# Patient Record
Sex: Male | Born: 1955 | Hispanic: No
Health system: Southern US, Community
[De-identification: ages and names within clinical notes are randomized; demographics above are authoritative.]

## PROBLEM LIST (undated history)

## (undated) DIAGNOSIS — I10 Essential (primary) hypertension: Secondary | ICD-10-CM

## (undated) HISTORY — DX: Essential (primary) hypertension: I10

---
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
## 2014-02-22 ENCOUNTER — Encounter: Payer: Self-pay | Admitting: Sports Medicine

## 2014-02-22 ENCOUNTER — Ambulatory Visit (INDEPENDENT_AMBULATORY_CARE_PROVIDER_SITE_OTHER): Payer: BC Managed Care – PPO

## 2014-02-22 ENCOUNTER — Ambulatory Visit (INDEPENDENT_AMBULATORY_CARE_PROVIDER_SITE_OTHER): Payer: BC Managed Care – PPO | Admitting: Sports Medicine

## 2014-02-22 VITALS — BP 124/90 | HR 68 | Ht 68.0 in | Wt 211.0 lb

## 2014-02-22 DIAGNOSIS — M7541 Impingement syndrome of right shoulder: Secondary | ICD-10-CM | POA: Insufficient documentation

## 2014-02-22 DIAGNOSIS — M25819 Other specified joint disorders, unspecified shoulder: Secondary | ICD-10-CM

## 2014-02-22 DIAGNOSIS — M7580 Other shoulder lesions, unspecified shoulder: Secondary | ICD-10-CM

## 2014-02-22 DIAGNOSIS — M5412 Radiculopathy, cervical region: Secondary | ICD-10-CM

## 2014-02-22 DIAGNOSIS — M79609 Pain in unspecified limb: Secondary | ICD-10-CM

## 2014-02-22 DIAGNOSIS — M5030 Other cervical disc degeneration, unspecified cervical region: Secondary | ICD-10-CM

## 2014-02-22 DIAGNOSIS — R209 Unspecified disturbances of skin sensation: Secondary | ICD-10-CM

## 2014-02-22 MED ORDER — GABAPENTIN 300 MG PO CAPS: ORAL_CAPSULE | ORAL | Status: DC

## 2014-02-22 MED ORDER — PREDNISONE 50 MG PO TABS: ORAL_TABLET | ORAL | Status: DC

## 2014-02-22 MED ORDER — MELOXICAM 15 MG PO TABS: ORAL_TABLET | ORAL | Status: DC

## 2014-02-22 NOTE — Progress Notes (Signed)
   Subjective:    I'm seeing this patient as a consultation for:  Dr. Terrence Dupont  CC:  Right arm numbness  HPI: This is a pleasant 58 year old male, for the past several weeks he's had numbness and tingling running down the back of his right arm into the third, fourth, and fifth fingers. It keeps him awake at night, moderate, persistent.  Right shoulder pain: Worse of the deltoid, and with overhead activities, moderate, persistent, no trauma, does get a clicking sensation.  Past medical history, Surgical history, Family history not pertinant except as noted below, Social history, Allergies, and medications have been entered into the medical record, reviewed, and no changes needed.   Review of Systems: No headache, visual changes, nausea, vomiting, diarrhea, constipation, dizziness, abdominal pain, skin rash, fevers, chills, night sweats, weight loss, swollen lymph nodes, body aches, joint swelling, muscle aches, chest pain, shortness of breath, mood changes, visual or auditory hallucinations.   Objective:   General: Well Developed, well nourished, and in no acute distress.  Neuro/Psych: Alert and oriented x3, extra-ocular muscles intact, able to move all 4 extremities, sensation grossly intact. Skin: Warm and dry, no rashes noted.  Respiratory: Not using accessory muscles, speaking in full sentences, trachea midline.  Cardiovascular: Pulses palpable, no extremity edema. Abdomen: Does not appear distended. Neck: Inspection unremarkable. No palpable stepoffs. Negative Spurling's maneuver. Full neck range of motion Grip strength and sensation normal in bilateral hands Strength good C4 to T1 distribution No sensory change to C4 to T1 Negative Hoffman sign bilaterally Reflexes normal Right Shoulder: Inspection reveals no abnormalities, atrophy or asymmetry. Palpation is normal with no tenderness over AC joint or bicipital groove. ROM is full in all planes. Rotator cuff strength normal  throughout. Positive Neer and Hawkin's tests, empty can sign. Speeds and Yergason's tests normal. No labral pathology noted with negative Obrien's, negative clunk and good stability. Normal scapular function observed. No painful arc and no drop arm sign. No apprehension sign  Shoulder x-rays are unremarkable, C-spine x-rays show multilevel degenerative changes worst at the C5-C6 levels.  Impression and Recommendations:   This case required medical decision making of moderate complexity.

## 2014-02-22 NOTE — Assessment & Plan Note (Signed)
Most likely subacromial bursitis. X-rays, formal PT, Mobic. Return in one month, injection if no better.

## 2014-02-22 NOTE — Assessment & Plan Note (Signed)
Prednisone, x-rays, Mobic, attention. Clinically right C7. Formal physical therapy. Return in one month, MRI for interventional injection planning if no better.

## 2014-03-08 ENCOUNTER — Ambulatory Visit (INDEPENDENT_AMBULATORY_CARE_PROVIDER_SITE_OTHER): Payer: BC Managed Care – PPO | Admitting: Physical Therapy

## 2014-03-08 DIAGNOSIS — M25819 Other specified joint disorders, unspecified shoulder: Secondary | ICD-10-CM

## 2014-03-08 DIAGNOSIS — M2550 Pain in unspecified joint: Secondary | ICD-10-CM

## 2014-03-08 DIAGNOSIS — M7580 Other shoulder lesions, unspecified shoulder: Secondary | ICD-10-CM

## 2014-03-08 DIAGNOSIS — M2560 Stiffness of unspecified joint, not elsewhere classified: Secondary | ICD-10-CM

## 2014-03-14 ENCOUNTER — Encounter (INDEPENDENT_AMBULATORY_CARE_PROVIDER_SITE_OTHER): Payer: BC Managed Care – PPO

## 2014-03-14 DIAGNOSIS — M7580 Other shoulder lesions, unspecified shoulder: Secondary | ICD-10-CM

## 2014-03-14 DIAGNOSIS — M2550 Pain in unspecified joint: Secondary | ICD-10-CM

## 2014-03-14 DIAGNOSIS — M2560 Stiffness of unspecified joint, not elsewhere classified: Secondary | ICD-10-CM

## 2014-03-14 DIAGNOSIS — M25819 Other specified joint disorders, unspecified shoulder: Secondary | ICD-10-CM

## 2014-03-16 ENCOUNTER — Encounter (INDEPENDENT_AMBULATORY_CARE_PROVIDER_SITE_OTHER): Payer: BC Managed Care – PPO

## 2014-03-16 DIAGNOSIS — M2550 Pain in unspecified joint: Secondary | ICD-10-CM

## 2014-03-16 DIAGNOSIS — M25819 Other specified joint disorders, unspecified shoulder: Secondary | ICD-10-CM

## 2014-03-16 DIAGNOSIS — M2560 Stiffness of unspecified joint, not elsewhere classified: Secondary | ICD-10-CM

## 2014-03-16 DIAGNOSIS — M7580 Other shoulder lesions, unspecified shoulder: Secondary | ICD-10-CM

## 2014-03-20 ENCOUNTER — Encounter (INDEPENDENT_AMBULATORY_CARE_PROVIDER_SITE_OTHER): Payer: BC Managed Care – PPO | Admitting: Physical Therapy

## 2014-03-20 DIAGNOSIS — M2550 Pain in unspecified joint: Secondary | ICD-10-CM

## 2014-03-20 DIAGNOSIS — M25819 Other specified joint disorders, unspecified shoulder: Secondary | ICD-10-CM

## 2014-03-20 DIAGNOSIS — M7580 Other shoulder lesions, unspecified shoulder: Secondary | ICD-10-CM

## 2014-03-20 DIAGNOSIS — M2560 Stiffness of unspecified joint, not elsewhere classified: Secondary | ICD-10-CM

## 2014-03-22 ENCOUNTER — Ambulatory Visit (INDEPENDENT_AMBULATORY_CARE_PROVIDER_SITE_OTHER): Payer: BC Managed Care – PPO | Admitting: Sports Medicine

## 2014-03-22 ENCOUNTER — Telehealth: Payer: Self-pay | Admitting: *Deleted

## 2014-03-22 ENCOUNTER — Encounter: Payer: Self-pay | Admitting: Sports Medicine

## 2014-03-22 VITALS — BP 113/78 | HR 64 | Ht 68.0 in | Wt 216.0 lb

## 2014-03-22 DIAGNOSIS — M5412 Radiculopathy, cervical region: Secondary | ICD-10-CM

## 2014-03-22 DIAGNOSIS — M7541 Impingement syndrome of right shoulder: Secondary | ICD-10-CM

## 2014-03-22 DIAGNOSIS — M7580 Other shoulder lesions, unspecified shoulder: Secondary | ICD-10-CM

## 2014-03-22 DIAGNOSIS — M25819 Other specified joint disorders, unspecified shoulder: Secondary | ICD-10-CM

## 2014-03-22 NOTE — Telephone Encounter (Signed)
Precert obtained through [facility] online for MRI C spine w/o contrast. Auth # [id number] good from 03/22/14 to 04/20/14. Clemetine Marker, LPN

## 2014-03-22 NOTE — Progress Notes (Signed)
  Subjective:    CC: Followup  HPI: Right shoulder impingement syndrome: Resolved with physical therapy and NSAIDs.  Right cervical radiculopathy : improved significantly after PT, steroids, in fact after his steroids he was 100% better, unfortunately the pain continues, it is mild but tends to bother him significantly. It is mild, persistent.  Past medical history, Surgical history, Family history not pertinant except as noted below, Social history, Allergies, and medications have been entered into the medical record, reviewed, and no changes needed.   Review of Systems: No fevers, chills, night sweats, weight loss, chest pain, or shortness of breath.   Objective:    General: Well Developed, well nourished, and in no acute distress.  Neuro: Alert and oriented x3, extra-ocular muscles intact, sensation grossly intact.  HEENT: Normocephalic, atraumatic, pupils equal round reactive to light, neck supple, no masses, no lymphadenopathy, thyroid nonpalpable.  Skin: Warm and dry, no rashes. Cardiac: Regular rate and rhythm, no murmurs rubs or gallops, no lower extremity edema.  Respiratory: Clear to auscultation bilaterally. Not using accessory muscles, speaking in full sentences.  He did have some suspicious findings on his x-rays.  Impression and Recommendations:

## 2014-03-22 NOTE — Assessment & Plan Note (Signed)
Symptoms of subacromial bursitis have completely resolved after formal physical therapy.

## 2014-03-22 NOTE — Assessment & Plan Note (Signed)
Persistent right-sided clinically C7 radicular symptoms. He did well with physical therapy, excellent response to steroids orally but only temporary. At this point we are going to proceed with an MRI for interventional injection planning. Return for MRI results.

## 2014-03-27 ENCOUNTER — Ambulatory Visit (HOSPITAL_BASED_OUTPATIENT_CLINIC_OR_DEPARTMENT_OTHER)
Admission: RE | Admit: 2014-03-27 | Discharge: 2014-03-27 | Disposition: A | Discharge status: Home / Self Care | Payer: BC Managed Care – PPO | Source: Ambulatory Visit | Attending: Sports Medicine | Admitting: Sports Medicine

## 2014-03-27 DIAGNOSIS — M5412 Radiculopathy, cervical region: Secondary | ICD-10-CM

## 2014-03-27 DIAGNOSIS — M5030 Other cervical disc degeneration, unspecified cervical region: Secondary | ICD-10-CM | POA: Insufficient documentation

## 2014-04-13 ENCOUNTER — Encounter: Payer: Self-pay | Admitting: Sports Medicine

## 2014-04-13 ENCOUNTER — Ambulatory Visit (INDEPENDENT_AMBULATORY_CARE_PROVIDER_SITE_OTHER): Payer: BC Managed Care – PPO | Admitting: Sports Medicine

## 2014-04-13 VITALS — BP 99/65 | HR 59 | Ht 69.0 in | Wt 215.0 lb

## 2014-04-13 DIAGNOSIS — M5412 Radiculopathy, cervical region: Secondary | ICD-10-CM

## 2014-04-13 NOTE — Progress Notes (Signed)
  Subjective:    CC: Followup  HPI: Cervical radiculitis: This pleasant 59 year old male has right-sided C7 radicular symptoms, he has been through formal physical therapy, steroids, NSAIDs, muscle relaxers, symptoms have persisted. We recently obtained an MRI the results of which will be dictated below. Pain is moderate, persistent, no constitutional symptoms, no lower extremity symptoms, no bowel or bladder dysfunction.  Past medical history, Surgical history, Family history not pertinant except as noted below, Social history, Allergies, and medications have been entered into the medical record, reviewed, and no changes needed.   Review of Systems: No fevers, chills, night sweats, weight loss, chest pain, or shortness of breath.   Objective:    General: Well Developed, well nourished, and in no acute distress.  Neuro: Alert and oriented x3, extra-ocular muscles intact, sensation grossly intact.  HEENT: Normocephalic, atraumatic, pupils equal round reactive to light, neck supple, no masses, no lymphadenopathy, thyroid nonpalpable.  Skin: Warm and dry, no rashes. Cardiac: Regular rate and rhythm, no murmurs rubs or gallops, no lower extremity edema.  Respiratory: Clear to auscultation bilaterally. Not using accessory muscles, speaking in full sentences.  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  Impression and Recommendations:

## 2014-04-13 NOTE — Assessment & Plan Note (Signed)
Persistent clinically right C7 symptoms despite physical therapy, steroids, gabapentin, NSAIDs. At this point we are going to proceed with a right-sided C6-C7 transforaminal epidural injection. Increase gabapentin. Orders placed.

## 2014-12-11 ENCOUNTER — Encounter: Payer: Self-pay | Admitting: Family Medicine

## 2014-12-11 ENCOUNTER — Ambulatory Visit (INDEPENDENT_AMBULATORY_CARE_PROVIDER_SITE_OTHER): Payer: 59

## 2014-12-11 ENCOUNTER — Other Ambulatory Visit: Payer: Self-pay | Admitting: Family Medicine

## 2014-12-11 ENCOUNTER — Ambulatory Visit (INDEPENDENT_AMBULATORY_CARE_PROVIDER_SITE_OTHER): Payer: 59 | Admitting: Family Medicine

## 2014-12-11 VITALS — BP 144/77 | HR 63 | Ht 69.0 in | Wt 209.0 lb

## 2014-12-11 DIAGNOSIS — M545 Low back pain, unspecified: Secondary | ICD-10-CM

## 2014-12-11 DIAGNOSIS — R1904 Left lower quadrant abdominal swelling, mass and lump: Secondary | ICD-10-CM

## 2014-12-11 DIAGNOSIS — R1909 Other intra-abdominal and pelvic swelling, mass and lump: Secondary | ICD-10-CM

## 2014-12-11 DIAGNOSIS — M4804 Spinal stenosis, thoracic region: Secondary | ICD-10-CM

## 2014-12-11 DIAGNOSIS — M2578 Osteophyte, vertebrae: Secondary | ICD-10-CM

## 2014-12-11 NOTE — Progress Notes (Signed)
CC: Mark Vazquez is a 59 y.o. male is here for Establish Care   Subjective: HPI:  Very pleasant 59 year old here to establish care  Patient complains of a growth in the left flank that has been present for an unknown amount of time. For at least the last month he has noticed in the mirror that the left flank appears bigger than the right flank. He believes that he can feel some swelling but he is uncertain about a mass in this region of swelling. He denies any recent or remote trauma to this region of the body. He denies any pain or tenderness to this region the body. No interventions as of yet. He takes it might be growing but he is unsure.  Complains of midline low back pain right greater than left that has been present off and on for matter of at least 3 months. It seems to be worse in cold weather. Is worse with bending down or rotating to the right. It is alleviated in warm weather or when sitting erect. Pain is nonradiating mild in severity and no interventions have occurred as of yet. He has meloxicam at home but has not tried taking it yet. Denies any motor or sensory disturbances in the lower extremities or saddle paresthesia or bowel or bladder incontinence  Review of Systems - General ROS: negative for - chills, fever, night sweats, weight gain or weight loss Ophthalmic ROS: negative for - decreased vision Psychological ROS: negative for - anxiety or depression ENT ROS: negative for - hearing change, nasal congestion, tinnitus or allergies Hematological and Lymphatic ROS: negative for - bleeding problems, bruising or swollen lymph nodes Breast ROS: negative Respiratory ROS: no cough, shortness of breath, or wheezing Cardiovascular ROS: no chest pain or dyspnea on exertion Gastrointestinal ROS: no abdominal pain, change in bowel habits, or black or bloody stools Genito-Urinary ROS: negative for - genital discharge, genital ulcers, incontinence or abnormal bleeding from  genitals Musculoskeletal ROS: negative for - joint pain or muscle pain other than back pain today Neurological ROS: negative for - headaches or memory loss Dermatological ROS: negative for lumps, mole changes, rash and skin lesion changes other than that described above  Past Medical History  Diagnosis Date  . Hypertension     No past surgical history on file. Family History  Problem Relation Age of Onset  . Hyperlipidemia    . Hypertension      History   Social History  . Marital Status: Significant Other    Spouse Name: N/A    Number of Children: N/A  . Years of Education: N/A   Occupational History  . Not on file.   Social History Main Topics  . Smoking status: Former Smoker    Quit date: 12/11/2008  . Smokeless tobacco: Not on file  . Alcohol Use: Not on file  . Drug Use: No  . Sexual Activity:    Partners: Female   Other Topics Concern  . Not on file   Social History Narrative     Objective: BP 144/77 mmHg  Pulse 63  Ht 5\' 9"  (1.753 m)  Wt 209 lb (94.802 kg)  BMI 30.85 kg/m2  General: Alert and Oriented, No Acute Distress HEENT: Pupils equal, round, reactive to light. Conjunctivae clear.  Moist mucous membranes Lungs: Clear comfortable work of breathing Cardiac: Regular rate and rhythm.  Abdomen: Normal bowel sounds, soft and non tender. In the left quadrant in the most lateral portion of the adipose tissue of the left  flank there is a approximate 2 cm x 3 cm egg-shaped mass which is soft but just slightly firmer than surrounding adipose tissue. It is nontender, there is no overlying skin changes. Back: No midline spinous process tenderness to palpation in the lumbar region nor paraspinal musculature tenderness to palpation in the paraspinal muscular region. Full range of motion and strength in the lumbar spine. Stork test negative bilaterally. Full range of motion and strength of both lower extremities with L4 and S1 DTR 2 over 4 bilaterally Extremities: No  peripheral edema.  Strong peripheral pulses.  Mental Status: No depression, anxiety, nor agitation. Skin: Warm and dry.  Assessment & Plan: Mark Vazquez was seen today for establish care.  Diagnoses and associated orders for this visit:  Midline low back pain - DG Lumbar Spine Complete; Future - DG Abd 2 Views; Future  Abdominal mass, LLQ (left lower quadrant) - DG Lumbar Spine Complete; Future - DG Abd 2 Views; Future    Midline low back pain: Encouraged to try meloxicam at home, obtaining x-rays above for further evaluation. 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 With this request an x-ray will be obtained as I anticipate we would be able to see calcifications at the site of this palpable abnormality if it were concerning for cancer  Return if symptoms worsen or fail to improve.

## 2014-12-12 ENCOUNTER — Telehealth: Payer: Self-pay | Admitting: Family Medicine

## 2014-12-12 DIAGNOSIS — M545 Low back pain, unspecified: Secondary | ICD-10-CM

## 2014-12-12 DIAGNOSIS — D171 Benign lipomatous neoplasm of skin and subcutaneous tissue of trunk: Secondary | ICD-10-CM | POA: Insufficient documentation

## 2014-12-12 MED ORDER — MELOXICAM 15 MG PO TABS: 15.0000 mg | ORAL_TABLET | Freq: Every day | ORAL | Status: DC

## 2014-12-12 MED ORDER — METHOCARBAMOL 500 MG PO TABS: 500.0000 mg | ORAL_TABLET | Freq: Three times a day (TID) | ORAL | Status: DC | PRN

## 2014-12-12 NOTE — Telephone Encounter (Signed)
Pt.notified

## 2014-12-12 NOTE — Telephone Encounter (Signed)
Low back exercises mailed

## 2014-12-12 NOTE — Telephone Encounter (Signed)
Mark Vazquez, Will you please let patient know that his back xray showed only minimal disc space narrowing but not to a degree that his spine would be causing his back pain.  I think his pain is coming from stiff muscles surrounding the spine therefore I'd recommend starting meloxicam and robaxin daily.  This is an anti-inflammatory and muscle relaxer, respectively.   Can you also provide him with the low back pain handout, stretches to be done on a daily basis for three weeks.  There was no signs of cancer in the swelling of his left abdomen, this is coming from a Lipoma which is benign and needs no further workup if painless.

## 2015-03-27 ENCOUNTER — Encounter: Payer: Self-pay | Admitting: Family Medicine

## 2015-03-27 ENCOUNTER — Ambulatory Visit (INDEPENDENT_AMBULATORY_CARE_PROVIDER_SITE_OTHER): Payer: 59 | Admitting: Family Medicine

## 2015-03-27 VITALS — BP 117/80 | HR 68 | Ht 69.0 in | Wt 207.0 lb

## 2015-03-27 DIAGNOSIS — Z1211 Encounter for screening for malignant neoplasm of colon: Secondary | ICD-10-CM

## 2015-03-27 DIAGNOSIS — K219 Gastro-esophageal reflux disease without esophagitis: Secondary | ICD-10-CM | POA: Diagnosis not present

## 2015-03-27 DIAGNOSIS — Z0000 Encounter for general adult medical examination without abnormal findings: Secondary | ICD-10-CM

## 2015-03-27 LAB — COMPLETE METABOLIC PANEL WITH GFR
ALT: 26 U/L (ref 0–53)
AST: 18 U/L (ref 0–37)
Albumin: 4.1 g/dL (ref 3.5–5.2)
Alkaline Phosphatase: 58 U/L (ref 39–117)
BUN: 16 mg/dL (ref 6–23)
CO2: 23 mEq/L (ref 19–32)
Calcium: 9.3 mg/dL (ref 8.4–10.5)
Chloride: 105 mEq/L (ref 96–112)
Creat: 1.05 mg/dL (ref 0.50–1.35)
GFR, Est African American: >89 mL/min
GFR, Est Non African American: 78 mL/min
Glucose, Bld: 82 mg/dL (ref 70–99)
Potassium: 4.1 mEq/L (ref 3.5–5.3)
Sodium: 137 mEq/L (ref 135–145)
Total Bilirubin: 0.5 mg/dL (ref 0.2–1.2)
Total Protein: 7.2 g/dL (ref 6.0–8.3)

## 2015-03-27 LAB — CBC
HCT: 45.4 % (ref 39.0–52.0)
Hemoglobin: 15 g/dL (ref 13.0–17.0)
MCH: 28.4 pg (ref 26.0–34.0)
MCHC: 33 g/dL (ref 30.0–36.0)
MCV: 85.8 fL (ref 78.0–100.0)
MPV: 11.7 fL (ref 8.6–12.4)
Platelets: 244 10*3/uL (ref 150–400)
RBC: 5.29 MIL/uL (ref 4.22–5.81)
RDW: 14.3 % (ref 11.5–15.5)
WBC: 6.9 10*3/uL (ref 4.0–10.5)

## 2015-03-27 LAB — LIPID PANEL
Cholesterol: 124 mg/dL (ref 0–200)
HDL: 36 mg/dL — ABNORMAL LOW (ref >=40)
LDL Cholesterol: 64 mg/dL (ref 0–99)
Total CHOL/HDL Ratio: 3.4 Ratio
Triglycerides: 122 mg/dL (ref <150)
VLDL: 24 mg/dL (ref 0–40)

## 2015-03-27 MED ORDER — PANTOPRAZOLE SODIUM 40 MG PO TBEC: 40.0000 mg | DELAYED_RELEASE_TABLET | Freq: Every day | ORAL | Status: DC

## 2015-03-27 NOTE — Patient Instructions (Signed)
Dr. Kymiah Araiza's General Advice Following Your Complete Physical Exam  The Benefits of Regular Exercise: Unless you suffer from an uncontrolled cardiovascular condition, studies strongly suggest that regular exercise and physical activity will add to both the quality and length of your life.  The World Health Organization recommends 150 minutes of moderate intensity aerobic activity every week.  This is best split over 3-4 days a week, and can be as simple as a brisk walk for just over 35 minutes "most days of the week".  This type of exercise has been shown to lower LDL-Cholesterol, lower average blood sugars, lower blood pressure, lower cardiovascular disease risk, improve memory, and increase one's overall sense of wellbeing.  The addition of anaerobic (or "strength training") exercises offers additional benefits including but not limited to increased metabolism, prevention of osteoporosis, and improved overall cholesterol levels.  How Can I Strive For A Low-Fat Diet?: Current guidelines recommend that 25-35 percent of your daily energy (food) intake should come from fats.  One might ask how can this be achieved without having to dissect each meal on a daily basis?  Switch to skim or 1% milk instead of whole milk.  Focus on lean meats such as ground turkey, fresh fish, baked chicken, and lean cuts of beef as your source of dietary protein.  Limit saturated fat consumption to less than 10% of your daily caloric intake.  Limit trans fatty acid consumption primarily by limiting synthetic trans fats such as partially hydrogenated oils (Ex: fried fast foods).  Substitute olive or vegetable oil for solid fats where possible.  Moderation of Salt Intake: Provided you don't carry a diagnosis of congestive heart failure nor renal failure, I recommend a daily allowance of no more than 2300 mg of salt (sodium).  Keeping under this daily goal is associated with a decreased risk of cardiovascular events, creeping  above it can lead to elevated blood pressures and increases your risk of cardiovascular events.  Milligrams (mg) of salt is listed on all nutrition labels, and your daily intake can add up faster than you think.  Most canned and frozen dinners can pack in over half your daily salt allowance in one meal.    Lifestyle Health Risks: Certain lifestyle choices carry specific health risks.  As you may already know, tobacco use has been associated with increasing one's risk of cardiovascular disease, pulmonary disease, numerous cancers, among many other issues.  What you may not know is that there are medications and nicotine replacement strategies that can more than double your chances of successfully quitting.  I would be thrilled to help manage your quitting strategy if you currently use tobacco products.  When it comes to alcohol use, I've yet to find an "ideal" daily allowance.  Provided an individual does not have a medical condition that is exacerbated by alcohol consumption, general guidelines determine "safe drinking" as no more than two standard drinks for a man or no more than one standard drink for a male per day.  However, much debate still exists on whether any amount of alcohol consumption is technically "safe".  My general advice, keep alcohol consumption to a minimum for general health promotion.  If you or others believe that alcohol, tobacco, or recreational drug use is interfering with your life, I would be happy to provide confidential counseling regarding treatment options.  General "Over The Counter" Nutrition Advice: Postmenopausal women should aim for a daily calcium intake of 1200 mg, however a significant portion of this might already be   provided by diets including milk, yogurt, cheese, and other dairy products.  Vitamin D has been shown to help preserve bone density, prevent fatigue, and has even been shown to help reduce falls in the elderly.  Ensuring a daily intake of 800 Units of  Vitamin D is a good place to start to enjoy the above benefits, we can easily check your Vitamin D level to see if you'd potentially benefit from supplementation beyond 800 Units a day.  Folic Acid intake should be of particular concern to women of childbearing age.  Daily consumption of 400-800 mcg of Folic Acid is recommended to minimize the chance of spinal cord defects in a fetus should pregnancy occur.    For many adults, accidents still remain one of the most common culprits when it comes to cause of death.  Some of the simplest but most effective preventitive habits you can adopt include regular seatbelt use, proper helmet use, securing firearms, and regularly testing your smoke and carbon monoxide detectors.  Sanjiv Castorena B. Pepper Wyndham DO [facility] [address], [address] [place], [place] [address] Phone: [phone]  

## 2015-03-27 NOTE — Progress Notes (Signed)
CC: Mark Vazquez is a 59 y.o. male is here for Annual Exam   Subjective: HPI:  Colonoscopy: Has never had a colonoscopy, this was encouraged today and a referral has been placed Prostate: Discussed screening risks/beneifts with patient during today's office visit, he is open to the idea of a PSA   Influenza Vaccine: No current indication Pneumovax: No current indication Td/Tdap: Up-to-date as of 3 years ago Zoster: (Start 58 yo)  Requesting complete physical exam with his only complaint being a right elbow pain that has been present for an unknown amount of time localized at the lateral epicondyle and worse with any extension of the wrist. Pain is mild in severity no interventions as of yet  He is requesting medication that is prescription strength and comparable to Prilosec OTC.  Review of Systems - General ROS: negative for - chills, fever, night sweats, weight gain or weight loss Ophthalmic ROS: negative for - decreased vision Psychological ROS: negative for - anxiety or depression ENT ROS: negative for - hearing change, nasal congestion, tinnitus or allergies Hematological and Lymphatic ROS: negative for - bleeding problems, bruising or swollen lymph nodes Breast ROS: negative Respiratory ROS: no cough, shortness of breath, or wheezing Cardiovascular ROS: no chest pain or dyspnea on exertion Gastrointestinal ROS: no abdominal pain, change in bowel habits, or black or bloody stools Genito-Urinary ROS: negative for - genital discharge, genital ulcers, incontinence or abnormal bleeding from genitals Musculoskeletal ROS: negative for - joint pain or muscle pain other than that described above Neurological ROS: negative for - headaches or memory loss Dermatological ROS: negative for lumps, mole changes, rash and skin lesion changes  Past Medical History  Diagnosis Date  . Hypertension     No past surgical history on file. Family History  Problem Relation Age of Onset  .  Hyperlipidemia    . Hypertension      History   Social History  . Marital Status: Significant Other    Spouse Name: N/A  . Number of Children: N/A  . Years of Education: N/A   Occupational History  . Not on file.   Social History Main Topics  . Smoking status: Former Smoker    Quit date: 12/11/2008  . Smokeless tobacco: Not on file  . Alcohol Use: Not on file  . Drug Use: No  . Sexual Activity:    Partners: Female   Other Topics Concern  . Not on file   Social History Narrative     Objective: BP 117/80 mmHg  Pulse 68  Ht 5\' 9"  (1.753 m)  Wt 207 lb (93.895 kg)  BMI 30.55 kg/m2  General: No Acute Distress HEENT: Atraumatic, normocephalic, conjunctivae normal without scleral icterus.  No nasal discharge, hearing grossly intact, TMs with good landmarks bilaterally with no middle ear abnormalities, posterior pharynx clear without oral lesions. Neck: Supple, trachea midline, no cervical nor supraclavicular adenopathy. Pulmonary: Clear to auscultation bilaterally without wheezing, rhonchi, nor rales. Cardiac: Regular rate and rhythm.  No murmurs, rubs, nor gallops. No peripheral edema.  2+ peripheral pulses bilaterally. Abdomen: Bowel sounds normal.  No masses.  Non-tender without rebound.  Negative Murphy's sign. MSK: Grossly intact, no signs of weakness.  Full strength throughout upper and lower extremities.  Full ROM in upper and lower extremities.  No midline spinal tenderness. Right lateral epicondyle pain with resisted wrist extension Neuro: Gait unremarkable, CN II-XII grossly intact.  C5-C6 Reflex 2/4 Bilaterally, L4 Reflex 2/4 Bilaterally.  Cerebellar function intact. Skin: No rashes. Psych: Alert  and oriented to person/place/time.  Thought process normal. No anxiety/depression.  Assessment & Plan: Ezra was seen today for annual exam.  Diagnoses and all orders for this visit:  Annual physical exam Orders: -     Lipid panel -     COMPLETE METABOLIC PANEL WITH  GFR -     CBC -     PSA  Colon cancer screening Orders: -     Ambulatory referral to Gastroenterology  Gastroesophageal reflux disease, esophagitis presence not specified  Other orders -     pantoprazole (PROTONIX) 40 MG tablet; Take 1 tablet (40 mg total) by mouth daily.   Healthy lifestyle interventions including but not limited to regular exercise, a healthy low fat diet, moderation of salt intake, the dangers of tobacco/alcohol/recreational drug use, nutrition supplementation, and accident avoidance were discussed with the patient and a handout was provided for future reference.  He was given a home rehabilitation handout for treatment of right tennis elbow. Encouraged to do this on a daily basis for 2 weeks at least.  Substitute Prilosec OTC with Protonix. Return if symptoms worsen or fail to improve.

## 2015-03-28 LAB — PSA: PSA: 0.38 ng/mL (ref <=4.00)

## 2015-08-07 ENCOUNTER — Other Ambulatory Visit: Payer: Self-pay | Admitting: Family Medicine

## 2015-09-25 ENCOUNTER — Other Ambulatory Visit: Payer: Self-pay | Admitting: Family Medicine

## 2015-11-17 IMAGING — CR DG CERVICAL SPINE COMPLETE 4+V
5 series · 5 of 5 positions shown · non-contrast
Comparison: None.

CLINICAL DATA: Right arm pain with numbness and tingling

EXAM:
CERVICAL SPINE  4+ VIEWS

[view not recorded (1 of 5)]
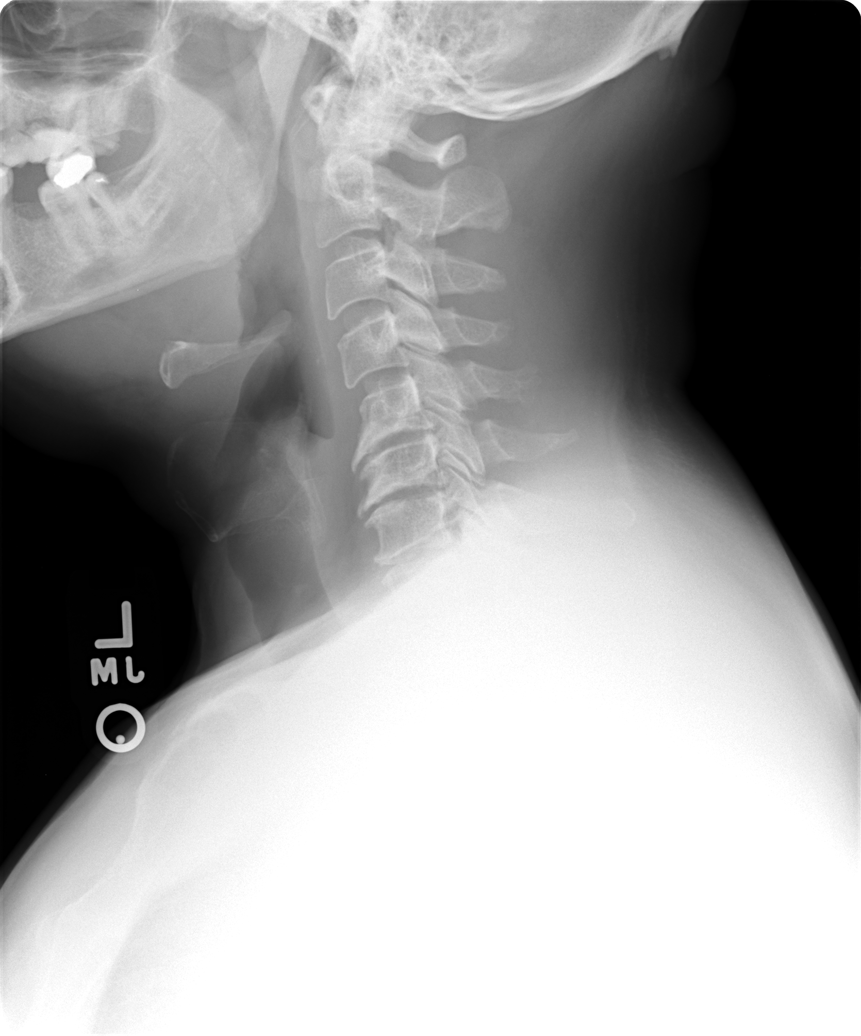

[view not recorded (2 of 5)]
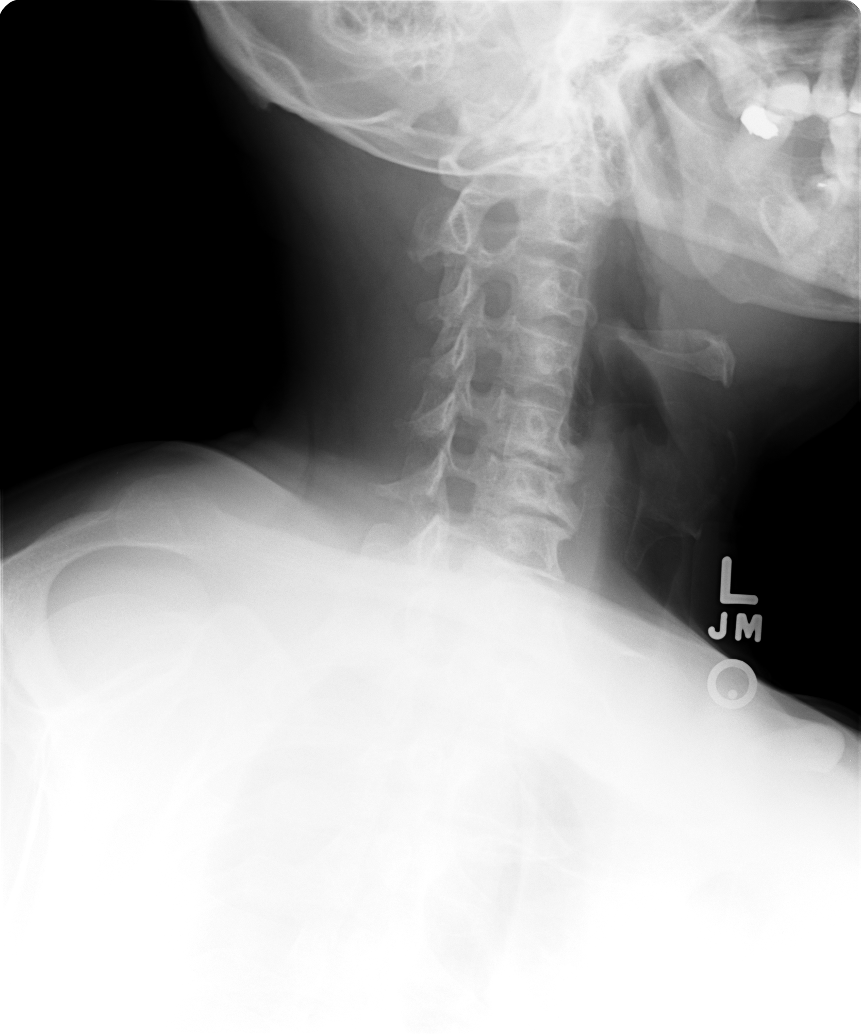

[view not recorded (3 of 5)]
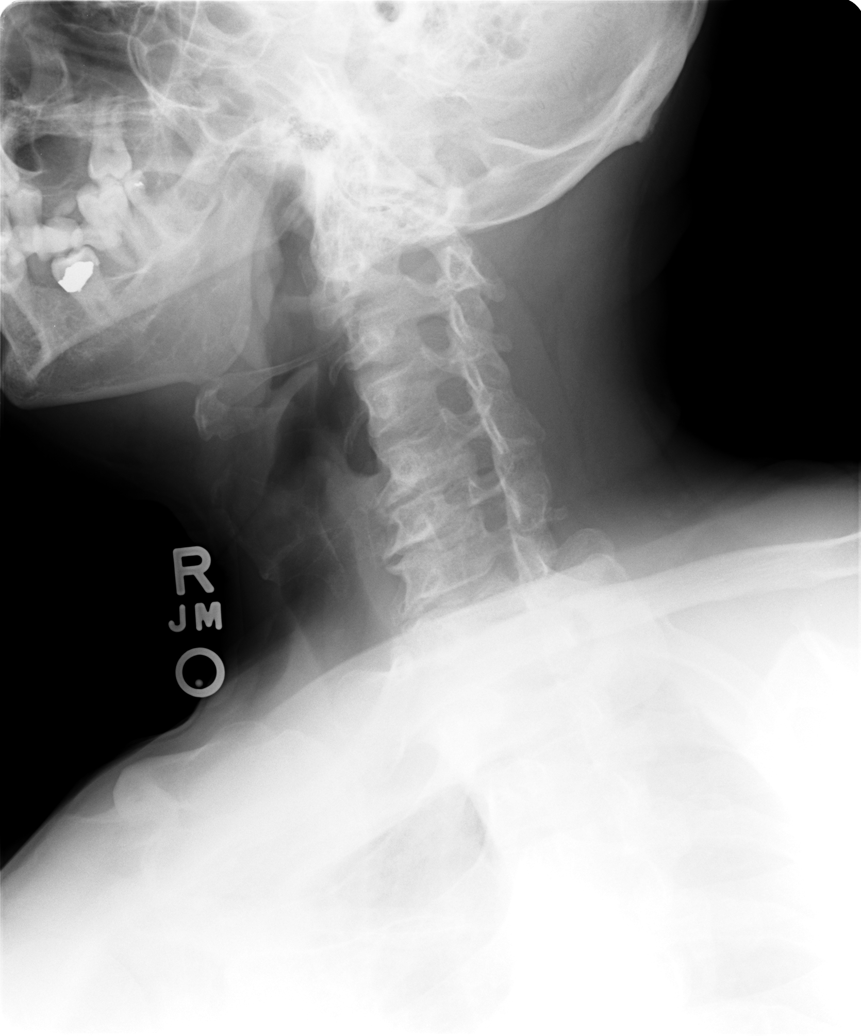

[view not recorded (4 of 5)]
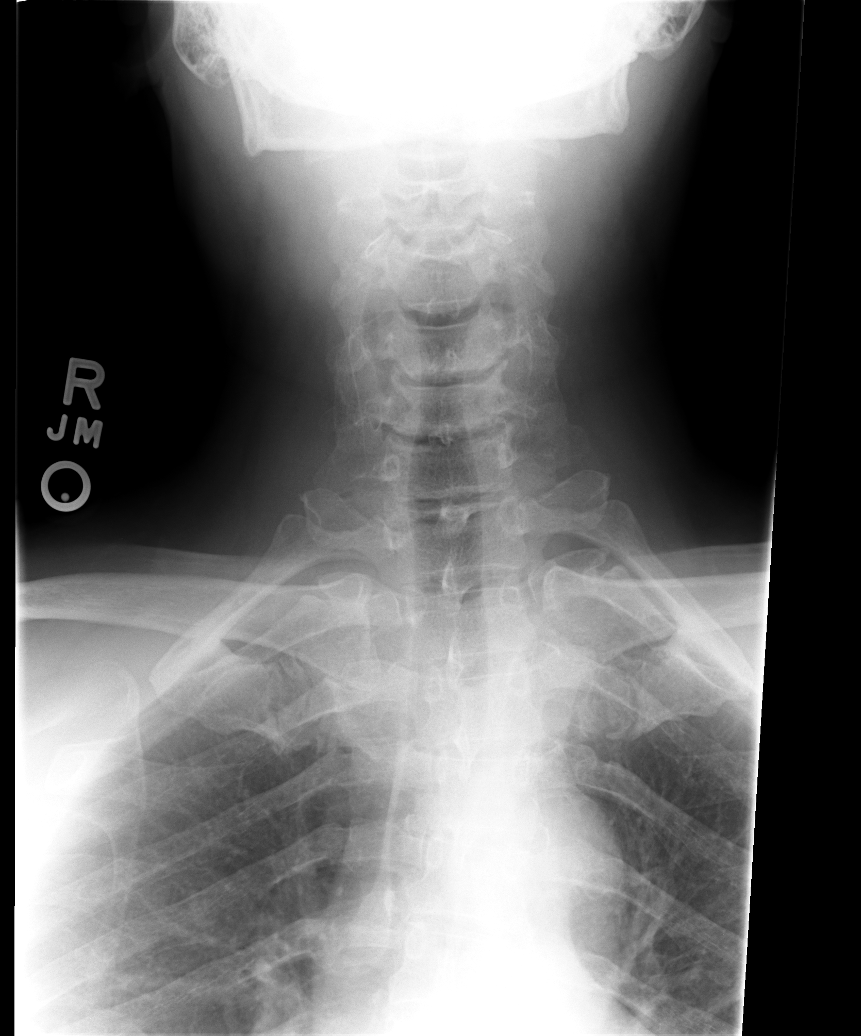

[view not recorded (5 of 5)]
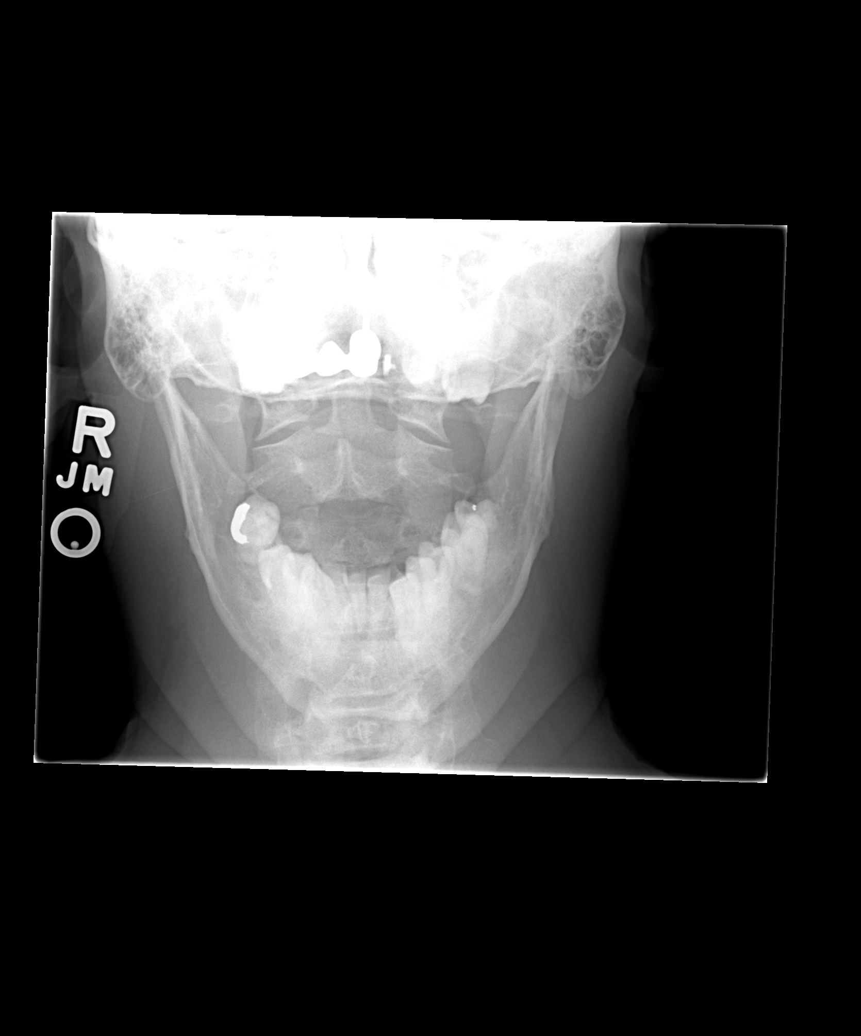

[5 of 5 positions shown; findings below may reference images not displayed]

FINDINGS: The cervical vertebrae are straightened in alignment. There is
degenerative disc disease at C5-6, C6-7, and C7-T1 levels. At these
levels there is loss of disc space, sclerosis, and spur formation.
No prevertebral soft tissue swelling is seen. On oblique views, only
mild foraminal narrowing is present at C5-6, with the remainder of
the foramina appearing patent. The odontoid process is intact. The
lung apices are clear.
IMPRESSION: Straightened alignment with degenerative disc disease from C5-T1.
Only mild foraminal narrowing is present at C5-6 .

## 2015-12-09 ENCOUNTER — Ambulatory Visit (INDEPENDENT_AMBULATORY_CARE_PROVIDER_SITE_OTHER): Payer: Managed Care, Other (non HMO) | Admitting: Family Medicine

## 2015-12-09 ENCOUNTER — Encounter: Payer: Self-pay | Admitting: Family Medicine

## 2015-12-09 VITALS — BP 145/98 | HR 68 | Wt 212.0 lb

## 2015-12-09 DIAGNOSIS — R05 Cough: Secondary | ICD-10-CM

## 2015-12-09 DIAGNOSIS — R059 Cough, unspecified: Secondary | ICD-10-CM

## 2015-12-09 MED ORDER — HYDROCODONE-HOMATROPINE 5-1.5 MG/5ML PO SYRP: 5.0000 mL | ORAL_SOLUTION | Freq: Three times a day (TID) | ORAL | Status: DC | PRN

## 2015-12-09 MED ORDER — AZITHROMYCIN 250 MG PO TABS: ORAL_TABLET | ORAL | Status: AC

## 2015-12-09 NOTE — Progress Notes (Signed)
CC: Mark Vazquez is a 60 y.o. male is here for Sore Throat   Subjective: HPI:  Nonproductive cough present for the last 2 weeks on a daily basis, worse at night. No benefit from Mucinex or Robitussin. Symptoms are moderate in severity. Denies sore throat and postnasal drip. Denies shortness of breath wheezing or chest discomfort. No fevers or chills.   Review Of Systems Outlined In HPI  Past Medical History  Diagnosis Date  . Hypertension     No past surgical history on file. Family History  Problem Relation Age of Onset  . Hyperlipidemia    . Hypertension      Social History   Social History  . Marital Status: Significant Other    Spouse Name: N/A  . Number of Children: N/A  . Years of Education: N/A   Occupational History  . Not on file.   Social History Main Topics  . Smoking status: Former Smoker    Quit date: 12/11/2008  . Smokeless tobacco: Not on file  . Alcohol Use: Not on file  . Drug Use: No  . Sexual Activity:    Partners: Female   Other Topics Concern  . Not on file   Social History Narrative     Objective: BP 145/98 mmHg  Pulse 68  Wt 212 lb (96.163 kg)  General: Alert and Oriented, No Acute Distress HEENT: Pupils equal, round, reactive to light. Conjunctivae clear.  External ears unremarkable, canals clear with intact TMs with appropriate landmarks.  Middle ear appears open without effusion. Pink inferior turbinates.  Moist mucous membranes, pharynx without inflammation nor lesions.  Neck supple without palpable lymphadenopathy nor abnormal masses. Lungs: Clear to auscultation bilaterally, no wheezing/ronchi/rales.  Comfortable work of breathing. Good air movement.occasional coughing Extremities: No peripheral edema.  Strong peripheral pulses.  Mental Status: No depression, anxiety, nor agitation. Skin: Warm and dry.  Assessment & Plan: Mark Vazquez was seen today for sore throat.  Diagnoses and all orders for this visit:  Cough -      azithromycin (ZITHROMAX) 250 MG tablet; Take two tabs at once on day 1, then one tab daily on days 2-5. -     HYDROcodone-homatropine (HYCODAN) 5-1.5 MG/5ML syrup; Take 5 mLs by mouth every 8 (eight) hours as needed for cough.   Start azithromycin and as needed Hycodan. Call if no better by Wednesday.very low suspicion for pneumonia.  No Follow-up on file.

## 2016-01-02 ENCOUNTER — Telehealth: Payer: Self-pay | Admitting: Family Medicine

## 2016-01-02 DIAGNOSIS — D171 Benign lipomatous neoplasm of skin and subcutaneous tissue of trunk: Secondary | ICD-10-CM

## 2016-01-02 NOTE — Telephone Encounter (Signed)
Patient called and would like a referral to have tumors removed. Patient lives in [place] and would prefer [place] or [place] if possible. Thanks

## 2016-05-19 ENCOUNTER — Ambulatory Visit: Payer: Managed Care, Other (non HMO) | Admitting: Family Medicine

## 2016-05-20 ENCOUNTER — Ambulatory Visit (INDEPENDENT_AMBULATORY_CARE_PROVIDER_SITE_OTHER): Payer: Managed Care, Other (non HMO) | Admitting: Family Medicine

## 2016-05-20 ENCOUNTER — Encounter: Payer: Self-pay | Admitting: Family Medicine

## 2016-05-20 VITALS — BP 107/73 | HR 82 | Wt 215.0 lb

## 2016-05-20 DIAGNOSIS — R05 Cough: Secondary | ICD-10-CM | POA: Diagnosis not present

## 2016-05-20 DIAGNOSIS — R059 Cough, unspecified: Secondary | ICD-10-CM

## 2016-05-20 DIAGNOSIS — K219 Gastro-esophageal reflux disease without esophagitis: Secondary | ICD-10-CM | POA: Diagnosis not present

## 2016-05-20 MED ORDER — HYDROCODONE-HOMATROPINE 5-1.5 MG/5ML PO SYRP: 5.0000 mL | ORAL_SOLUTION | Freq: Three times a day (TID) | ORAL | Status: DC | PRN

## 2016-05-20 MED ORDER — PANTOPRAZOLE SODIUM 40 MG PO TBEC: 40.0000 mg | DELAYED_RELEASE_TABLET | Freq: Every day | ORAL | Status: AC

## 2016-05-20 MED ORDER — PREDNISONE 20 MG PO TABS: ORAL_TABLET | ORAL | Status: AC

## 2016-05-20 NOTE — Progress Notes (Signed)
CC: Mark Vazquez is a 60 y.o. male is here for Cough   Subjective: HPI:  Nonproductive cough present for the last 2 weeks. Worse with talking. No benefit from over-the-counter cough and cold medication such as cough drops, Robitussin or zyrtec. He denies any shortness of breath, wheezing, chest discomfort, fevers, chills, nor nasal congestion. Symptoms are worse at night. No environmental influence to his symptoms. Symptoms are moderate in severity but persistent. Symptoms are keeping him up at night  He ran out of Protonix and wants to know if he can get a refill. As soon as he ran out of it he began to develop epigastric discomfort that radiated up into the back of his throat. He denies any exertional component to his discomfort. He denies any constipation, diarrhea or any other gastrointestinal complaints symptoms are mild-to-moderate in severity if he doesn't take Protonix   Review Of Systems Outlined In HPI  Past Medical History  Diagnosis Date  . Hypertension     No past surgical history on file. Family History  Problem Relation Age of Onset  . Hyperlipidemia    . Hypertension      Social History   Social History  . Marital Status: Significant Other    Spouse Name: N/A  . Number of Children: N/A  . Years of Education: N/A   Occupational History  . Not on file.   Social History Main Topics  . Smoking status: Former Smoker    Quit date: 12/11/2008  . Smokeless tobacco: Not on file  . Alcohol Use: Not on file  . Drug Use: No  . Sexual Activity:    Partners: Female   Other Topics Concern  . Not on file   Social History Narrative     Objective: BP 107/73 mmHg  Pulse 82  Wt 215 lb (97.523 kg)  General: Alert and Oriented, No Acute Distress HEENT: Pupils equal, round, reactive to light. Conjunctivae clear.  External ears unremarkable, canals clear with intact TMs with appropriate landmarks.  Middle ear appears open without effusion. Pink inferior turbinates.   Moist mucous membranes, pharynx without inflammation nor lesions.  Neck supple without palpable lymphadenopathy nor abnormal masses. Lungs: Clear to auscultation bilaterally, no wheezing/ronchi/rales.  Comfortable work of breathing. Good air movement. Cardiac: Regular rate and rhythm. Normal S1/S2.  No murmurs, rubs, nor gallops.   Extremities: No peripheral edema.  Strong peripheral pulses.  Mental Status: No depression, anxiety, nor agitation. Skin: Warm and dry.  Assessment & Plan: Mark Vazquez was seen today for cough.  Diagnoses and all orders for this visit:  Cough -     predniSONE (DELTASONE) 20 MG tablet; Three tabs daily days 1-3, two tabs daily days 4-6, one tab daily days 7-9, half tab daily days 10-13. -     HYDROcodone-homatropine (HYCODAN) 5-1.5 MG/5ML syrup; Take 5 mLs by mouth every 8 (eight) hours as needed for cough.  Gastroesophageal reflux disease, esophagitis presence not specified  Other orders -     pantoprazole (PROTONIX) 40 MG tablet; Take 1 tablet (40 mg total) by mouth daily.   Cough: Start prednisone taper call if no improvement by Friday. Low suspicion of bacterial infection but if not improved by Friday will offer azithromycin. Hycodan provided today to help with sleep GERD: Uncontrolled chronic condition without Protonix restart former regimen   Return if symptoms worsen or fail to improve.

## 2016-06-16 ENCOUNTER — Ambulatory Visit (INDEPENDENT_AMBULATORY_CARE_PROVIDER_SITE_OTHER): Payer: Managed Care, Other (non HMO) | Admitting: Osteopathic Medicine

## 2016-06-16 VITALS — BP 118/83 | HR 67 | Ht 69.0 in | Wt 211.0 lb

## 2016-06-16 DIAGNOSIS — I25119 Atherosclerotic heart disease of native coronary artery with unspecified angina pectoris: Secondary | ICD-10-CM | POA: Diagnosis not present

## 2016-06-16 DIAGNOSIS — J302 Other seasonal allergic rhinitis: Secondary | ICD-10-CM

## 2016-06-16 DIAGNOSIS — R1030 Lower abdominal pain, unspecified: Secondary | ICD-10-CM

## 2016-06-16 LAB — CBC
HCT: 47.4 % (ref 38.5–50.0)
Hemoglobin: 15.8 g/dL (ref 13.2–17.1)
MCH: 29.5 pg (ref 27.0–33.0)
MCHC: 33.3 g/dL (ref 32.0–36.0)
MCV: 88.6 fL (ref 80.0–100.0)
MPV: 11.1 fL (ref 7.5–12.5)
Platelets: 235 10*3/uL (ref 140–400)
RBC: 5.35 MIL/uL (ref 4.20–5.80)
RDW: 14.5 % (ref 11.0–15.0)
WBC: 6.8 10*3/uL (ref 3.8–10.8)

## 2016-06-16 LAB — LIPID PANEL
Cholesterol: 161 mg/dL (ref 125–200)
HDL: 49 mg/dL (ref >=40)
LDL Cholesterol: 88 mg/dL (ref <130)
Total CHOL/HDL Ratio: 3.3 Ratio (ref <=5.0)
Triglycerides: 118 mg/dL (ref <150)
VLDL: 24 mg/dL (ref <30)

## 2016-06-16 LAB — COMPLETE METABOLIC PANEL WITH GFR
ALT: 32 U/L (ref 9–46)
AST: 21 U/L (ref 10–35)
Albumin: 4 g/dL (ref 3.6–5.1)
Alkaline Phosphatase: 54 U/L (ref 40–115)
BUN: 16 mg/dL (ref 7–25)
CO2: 28 mmol/L (ref 20–31)
Calcium: 9.6 mg/dL (ref 8.6–10.3)
Chloride: 104 mmol/L (ref 98–110)
Creat: 1.15 mg/dL (ref 0.70–1.33)
GFR, Est African American: 80 mL/min (ref >=60)
GFR, Est Non African American: 69 mL/min (ref >=60)
Glucose, Bld: 104 mg/dL — ABNORMAL HIGH (ref 65–99)
Potassium: 4.6 mmol/L (ref 3.5–5.3)
Sodium: 139 mmol/L (ref 135–146)
Total Bilirubin: 0.5 mg/dL (ref 0.2–1.2)
Total Protein: 7 g/dL (ref 6.1–8.1)

## 2016-06-16 LAB — POCT URINALYSIS DIPSTICK
Bilirubin, UA: NEGATIVE
Blood, UA: NEGATIVE
Glucose, UA: NEGATIVE
Ketones, UA: NEGATIVE
Leukocytes, UA: NEGATIVE
Nitrite, UA: NEGATIVE
Protein, UA: NEGATIVE
Spec Grav, UA: 1.02
Urobilinogen, UA: 0.2
pH, UA: 5.5

## 2016-06-16 LAB — LIPASE: Lipase: 17 U/L (ref 7–60)

## 2016-06-16 MED ORDER — NAPROXEN 500 MG PO TABS: 500.0000 mg | ORAL_TABLET | Freq: Two times a day (BID) | ORAL | 0 refills | Status: AC

## 2016-06-16 MED ORDER — FEXOFENADINE HCL 180 MG PO TABS: 180.0000 mg | ORAL_TABLET | Freq: Every day | ORAL | 3 refills | Status: AC

## 2016-06-16 NOTE — Progress Notes (Signed)
HPI: Mark Vazquez is a 60 y.o. Hispanic or Latino male  who presents to [facility] today, 06/16/16,  for chief complaint of:  Chief Complaint  Patient presents with  . Abdominal Pain    LOWER    Lower abdominal pain . Context: No injury, no recent travel, no unusual foods . Location: lower abdomen and lower back - points to area suprapubic/just under belt buckle . Quality: Soreness, bothersome . Duration: about 4 days but getting better over that time . Timing: Worse when he has to stand up or twist  Assoc signs/symptoms: Hx GERD controlled on Protonix. See review of systems below, no other concerning GI symptoms  Past medical, surgical, social and family history reviewed: Past Medical History:  Diagnosis Date  . Hypertension    No past surgical history on file. Social History  Substance Use Topics  . Smoking status: Former Smoker    Quit date: 12/11/2008  . Smokeless tobacco: Not on file  . Alcohol use Not on file   Family History  Problem Relation Age of Onset  . Hyperlipidemia    . Hypertension       Current medication list and allergy/intolerance information reviewed:   Current Outpatient Prescriptions  Medication Sig Dispense Refill  . pantoprazole (PROTONIX) 40 MG tablet Take 1 tablet (40 mg total) by mouth daily. 90 tablet 1   No current facility-administered medications for this visit.    No Known Allergies    Review of Systems:  Constitutional:  No  fever, no chills, No recent illness, No unintentional weight changes But endorses intentional weight loss of several pounds. No significant fatigue.  Cardiac: No  chest pain, No  pressure, No palpitations  Respiratory:  No  shortness of breath. No  Cough  Gastrointestinal: (+)abdominal pain, No  nausea, No  vomiting,  No  blood in stool, No  diarrhea, No  constipation   Musculoskeletal: No new myalgia/arthralgia  Genitourinary: No  incontinence, No  abnormal genital  bleeding, No abnormal genital discharge, no testicular pain or enlargement  Skin: No  Rash, No other wounds/concerning lesions   Exam:  BP 118/83   Pulse 67   Ht 5\' 9"  (1.753 m)   Wt 211 lb (95.7 kg)   BMI 31.16 kg/m   Constitutional: VS see above. General Appearance: alert, well-developed, well-nourished, NAD  Ears, Nose, Mouth, Throat: MMM, Normal external inspection ears/nares/mouth/lips/gums.  Respiratory: Normal respiratory effort. no wheeze, no rhonchi, no rales  Cardiovascular: S1/S2 normal, no murmur, no rub/gallop auscultated. RRR. No lower extremity edema.  Gastrointestinal: Slight abdominal obesity. Slight tenderness suprapubic area just below pannus, no masses. No hepatomegaly, no splenomegaly. No hernia appreciated on abdominal or inguinal exam. Bowel sounds normal. Rectal exam deferred.   Musculoskeletal: Gait normal. No clubbing/cyanosis of digits.   Neurological: No cranial nerve deficit on limited exam. Motor and sensation intact and symmetric.  Skin: warm, dry, intact. No rash/ulcer. No concerning nevi or subq nodules on limited exam.    Psychiatric: Normal judgment/insight. Normal mood and affect. Oriented x3.    Previous imaging reviewed: X-ray abdomen 12/11/2014 performed for midline low back pain and left flank palpable mass and adipose tissue, normal XR Previous labs reviewed: Normal CMP 1 year ago 03/27/2015   ASSESSMENT/PLAN:   No red flags for lower abdominal pain, good suspicion is for low back pain/musculoskeletal pain, also patient notes he has had some intentional weight loss, has been able to get his belt a bit tighter, his pain  is right in the area where the belt buckle is located, could also consider this as a factor as well. I advised consider back brace/supportive aide. Trial NSAIDs for pain relief, caution given cardiac history. Inserter physical therapy. Reassuring patient states that his symptoms have gotten better over the past several days.  I think watchful waiting is appropriate in this case and consider further intervention based on persistent/worsening of symptoms.  Patient also has prescription from his cardiologist requesting BMP, liver panel, lipids 24 to him. Medications also updated from cardiologist. See below for labs.  Lower abdominal pain - Likely MSK strain, exam benign, no hernia, no red flag symptoms. Consider imaging if no better, labs pending - Plan: COMPLETE METABOLIC PANEL WITH GFR, CBC, Lipase, POCT Urinalysis Dipstick  Coronary artery disease involving native coronary artery of native heart with angina pectoris (HCC) - Plan: Lipid panel  Seasonal allergies - requests and in medication for seasonal allergies, declines nasal steroid     Visit summary with medication list and pertinent instructions was printed for patient to review. All questions at time of visit were answered - patient instructed to contact office with any additional concerns. ER/RTC precautions were reviewed with the patient. Follow-up plan: Return if symptoms worsen or fail to improve, and based on labs.  Note: Total time spent 25 minutes, greater than 50% of the visit was spent face-to-face counseling and coordinating care for the following: The primary encounter diagnosis was Lower abdominal pain. Diagnoses of Coronary artery disease involving native coronary artery of native heart with angina pectoris (HCC) and Seasonal allergies were also pertinent to this visit.Marland Kitchen

## 2016-06-16 NOTE — Patient Instructions (Addendum)
I think your pain is mostly due to a strain of the back and front abdominal muscles. Would recommend rest, anti-inflammatory medications as prescribed, will double check labs to make sure nothing else is going on. If your pain is not getting any better, or if it is getting worse, let us know right away and we may need to consider doing a scan for further workup. Would also recommend, since her quite active at your job moving around a lot, consider back brace to support the musculature and avoid future injury.  Please let us know if there is anything else we can do for you!

## 2016-06-17 ENCOUNTER — Encounter: Payer: Self-pay | Admitting: Osteopathic Medicine

## 2016-11-24 ENCOUNTER — Encounter: Payer: Self-pay | Admitting: Family Medicine

## 2016-11-24 ENCOUNTER — Other Ambulatory Visit: Payer: Self-pay | Admitting: Family Medicine

## 2016-11-24 ENCOUNTER — Ambulatory Visit (INDEPENDENT_AMBULATORY_CARE_PROVIDER_SITE_OTHER): Payer: Managed Care, Other (non HMO) | Admitting: Family Medicine

## 2016-11-24 VITALS — BP 126/75 | HR 77 | Ht 69.0 in | Wt 225.0 lb

## 2016-11-24 DIAGNOSIS — J018 Other acute sinusitis: Secondary | ICD-10-CM

## 2016-11-24 DIAGNOSIS — J309 Allergic rhinitis, unspecified: Secondary | ICD-10-CM | POA: Insufficient documentation

## 2016-11-24 DIAGNOSIS — J302 Other seasonal allergic rhinitis: Secondary | ICD-10-CM | POA: Diagnosis not present

## 2016-11-24 MED ORDER — AMOXICILLIN-POT CLAVULANATE 875-125 MG PO TABS: 1.0000 | ORAL_TABLET | Freq: Two times a day (BID) | ORAL | 0 refills | Status: AC

## 2016-11-24 NOTE — Progress Notes (Signed)
Subjective:    Patient ID: Mark Vazquez, male    DOB: [birth year], 61 y.o.   MRN: [id number]  HPI  Patient comes in today complaining of cough and sinus congestion and intermittent laryngitis for over a month. He says he's had a sensation in his throat like something is stuck there and like there is a tickle. He denies any headache or facial pain or pressure.He's tried taking over-the-counter cold medications and allergy medications without significant relief. He is mostly concerned that he may have allergies to some environmental factors. He has several family members that have allergy problems. No GI sxs.    Review of Systems   BP 126/75   Pulse 77   Ht 5\' 9"  (1.753 m)   Wt 225 lb (102.1 kg)   SpO2 99%   BMI 33.23 kg/m     No Known Allergies  Past Medical History:  Diagnosis Date  . Hypertension     No past surgical history on file.  Social History   Social History  . Marital status: Significant Other    Spouse name: N/A  . Number of children: N/A  . Years of education: N/A   Occupational History  . Not on file.   Social History Main Topics  . Smoking status: Former Smoker    Quit date: 12/11/2008  . Smokeless tobacco: Not on file  . Alcohol use Not on file  . Drug use: No  . Sexual activity: Yes    Partners: Female   Other Topics Concern  . Not on file   Social History Narrative  . No narrative on file    Family History  Problem Relation Age of Onset  . Hyperlipidemia    . Hypertension      Outpatient Encounter Prescriptions as of 11/24/2016  Medication Sig  . aspirin 81 MG tablet Take 81 mg by mouth daily.  Marland Kitchen atorvastatin (LIPITOR) 20 MG tablet   . carvedilol (COREG) 6.25 MG tablet Take by mouth.  . fexofenadine (ALLEGRA) 180 MG tablet Take 1 tablet (180 mg total) by mouth daily.  . naproxen (NAPROSYN) 500 MG tablet Take 1 tablet (500 mg total) by mouth 2 (two) times daily with a meal. As needed for pain  . nitroGLYCERIN (NITROSTAT) 0.4 MG SL  tablet   . pantoprazole (PROTONIX) 40 MG tablet Take 1 tablet (40 mg total) by mouth daily.  . valsartan-hydrochlorothiazide (DIOVAN-HCT) 160-12.5 MG tablet Take by mouth.  Marland Kitchen amoxicillin-clavulanate (AUGMENTIN) 875-125 MG tablet Take 1 tablet by mouth 2 (two) times daily.   No facility-administered encounter medications on file as of 11/24/2016.           Objective:   Physical Exam  Constitutional: He is oriented to person, place, and time. He appears well-developed and well-nourished.  HENT:  Head: Normocephalic and atraumatic.  Right Ear: External ear normal.  Left Ear: External ear normal.  Nose: Nose normal.  Mouth/Throat: Oropharynx is clear and moist.  TMs and canals are clear.   Eyes: Conjunctivae and EOM are normal. Pupils are equal, round, and reactive to light.  Neck: Neck supple. No thyromegaly present.  Cardiovascular: Normal rate and normal heart sounds.   Pulmonary/Chest: Effort normal and breath sounds normal.  Lymphadenopathy:    He has no cervical adenopathy.  Neurological: He is alert and oriented to person, place, and time.  Skin: Skin is warm and dry.  Psychiatric: He has a normal mood and affect.       Assessment & Plan:  Acute sinusitis-we'll treat with Augmentin. Call if not better in one week.   Allergic rhinitis- he decided to do labs instead of allergy referral for skin testing.  Once has labs can start Allegra 180mg .  Consider prednisone if not improving.

## 2016-11-25 ENCOUNTER — Telehealth: Payer: Self-pay | Admitting: Family Medicine

## 2016-11-25 LAB — RESPIRATORY ALLERGY PROFILE REGION II ~~LOC~~
Allergen, A. alternata, m6: <0.1 kU/L
Allergen, C. Herbarum, M2: <0.1 kU/L
Allergen, Cedar tree, t12: <0.1 kU/L
Allergen, Comm Silver Birch, t9: <0.1 kU/L
Allergen, Cottonwood, t14: <0.1 kU/L
Allergen, D pternoyssinus,d7: 0.49 kU/L — ABNORMAL HIGH
Allergen, Mouse Urine Protein, e78: <0.1 kU/L
Allergen, Mulberry, t76: <0.1 kU/L
Allergen, Oak,t7: <0.1 kU/L
Allergen, P. notatum, m1: <0.1 kU/L
Aspergillus fumigatus, m3: <0.1 kU/L
Bermuda Grass: <0.1 kU/L
Box Elder IgE: <0.1 kU/L
Cat Dander: 1.2 kU/L — ABNORMAL HIGH
Cockroach: 1.24 kU/L — ABNORMAL HIGH
Common Ragweed: <0.1 kU/L
D. farinae: 0.32 kU/L — ABNORMAL HIGH
Dog Dander: 0.21 kU/L — ABNORMAL HIGH
Elm IgE: <0.1 kU/L
IgE (Immunoglobulin E), Serum: 178 kU/L — ABNORMAL HIGH (ref <115)
Johnson Grass: <0.1 kU/L
Pecan/Hickory Tree IgE: <0.1 kU/L
Rough Pigweed  IgE: <0.1 kU/L
Sheep Sorrel IgE: <0.1 kU/L
Timothy Grass: <0.1 kU/L

## 2016-11-25 NOTE — Telephone Encounter (Signed)
Pt's EC informed of results.  Advised that he can continue to take the allegra. She stated that the Allegra is not helping him. Is there something else that can be given? Will fwd to pcp for advice.Mark Vazquez'

## 2016-11-25 NOTE — Telephone Encounter (Signed)
See result note.  

## 2016-11-25 NOTE — Telephone Encounter (Signed)
Patient called and request to know if labs came back yet was checking to see if Dr. Jerilynn Mages called the meds in based off lab work. Thanks

## 2016-11-26 MED ORDER — PREDNISONE 20 MG PO TABS: 40.0000 mg | ORAL_TABLET | Freq: Every day | ORAL | 0 refills | Status: AC

## 2016-11-26 NOTE — Telephone Encounter (Signed)
Pt informed.Mark Vazquez  

## 2016-11-26 NOTE — Telephone Encounter (Signed)
Call pt: will give 5 days of prednisone and see if provides some relief but keep taking the Allegra too.

## 2017-01-07 ENCOUNTER — Ambulatory Visit (INDEPENDENT_AMBULATORY_CARE_PROVIDER_SITE_OTHER): Payer: Managed Care, Other (non HMO) | Admitting: Sports Medicine

## 2017-01-07 ENCOUNTER — Ambulatory Visit (INDEPENDENT_AMBULATORY_CARE_PROVIDER_SITE_OTHER): Payer: Managed Care, Other (non HMO)

## 2017-01-07 DIAGNOSIS — M25522 Pain in left elbow: Secondary | ICD-10-CM

## 2017-01-07 DIAGNOSIS — M7521 Bicipital tendinitis, right shoulder: Secondary | ICD-10-CM | POA: Diagnosis not present

## 2017-01-07 DIAGNOSIS — M5412 Radiculopathy, cervical region: Secondary | ICD-10-CM

## 2017-01-07 MED ORDER — MELOXICAM 15 MG PO TABS: ORAL_TABLET | ORAL | 3 refills | Status: AC

## 2017-01-07 NOTE — Assessment & Plan Note (Signed)
Classic right distal biceps tendinitis with reproduction of pain with resisted supination of the right hand. He does have good strength so I do not  suspect rupture or tear. Meloxicam, physical therapy, x-rays of the elbow, compressive elbow sleeve.  Return in 4 weeks, distal biceps injection under ultrasound guidance if insufficient relief. He will do restricted duty at work, no lifting greater than 20 pounds for the next 2 weeks.

## 2017-01-07 NOTE — Assessment & Plan Note (Signed)
Had epidurals a couple of years ago at an outside facility with fantastic relief of his radiculopathy.

## 2017-01-07 NOTE — Patient Instructions (Signed)
Biceps Tendon Tendinitis (Distal) Distal biceps tendon tendinitis is inflammation of the distal biceps tendon. The distal biceps tendon is a strong cord of tissue that connects the biceps muscle, on the front of the upper arm, to a bone (radius) in the elbow. Distal biceps tendon tendinitis can interfere with the ability to bend the elbow and turn the hand palm-up (supination). This condition is usually caused by overusing the elbow joint and the biceps muscle, and it usually heals within 6 weeks. Distal biceps tendon tendinitis may include a grade 1 or grade 2 strain of the tendon. A grade 1 strain is mild, and it involves a slight pull of the tendon without any stretching or noticeable tearing of the tendon. There is usually no loss of biceps muscle strength. A grade 2 strain is moderate, and it involves a small tear in the tendon. The tendon is stretched, and biceps muscle strength is usually decreased. What are the causes? This condition may be caused by:  A sudden increase in the frequency or intensity of activity that involves the elbow and the biceps muscle.  Overuse of the biceps muscle. This can happen when you do the same movements over and over, such as:  Supination.  Forceful straightening (hyperextension) of the elbow.  Bending of the elbow.  A direct, forceful hit or injury (trauma) to the elbow. This is rare. What increases the risk? The following factors may make you more likely to develop this condition:  Playing contact sports.  Playing sports that involve throwing and overhead movements, including racket sports, gymnastics, weight lifting, or bodybuilding.  Doing physical labor.  Having poor strength and flexibility of the arm and shoulder.  Having injured other parts of the elbow. What are the signs or symptoms? Symptoms of this condition may include:  Pain and inflammation in the front of the elbow. Pain may get worse during certain movements, such  as:  Supination.  Bending the elbow.  Lifting or carrying objects.  Throwing.  A feeling of warmth in the front of the elbow.  A crackling sound (crepitation) when you move or touch the elbow or the upper arm. In some cases, symptoms may return (recur) after treatment, and they may be long-lasting (chronic). How is this diagnosed? This condition is diagnosed based on your symptoms, your medical history, and a physical exam. You may have tests, including X-rays or MRIs. Your health care provider may test your range of motion by having you do arm movements. How is this treated? This condition is treated by resting and icing the injured area, and by doing physical therapy exercises. Depending on the severity of your condition, treatment may also include:  Medicines to help relieve pain and inflammation.  Ultrasound therapy. This is the application of sound waves to the injured area. Follow these instructions at home: Managing pain, stiffness, and swelling  If directed, put ice on the injured area:  Put ice in a plastic bag.  Place a towel between your skin and the bag.  Leave the ice on for 20 minutes, 2-3 times a day.  Move your fingers often to avoid stiffness and to lessen swelling.  Raise (elevate) the injured area above the level of your heart while you are sitting or lying down.  If directed, apply heat to the affected area before you exercise. Use the heat source that your health care provider recommends, such as a moist heat pack or a heating pad.  Place a towel between your skin and the  heat source.  Leave the heat on for 20-30 minutes.  Remove the heat if your skin turns bright red. This is especially important if you are unable to feel pain, heat, or cold. You may have a greater risk of getting burned. Activity  Return to your normal activities as told by your health care provider. Ask your health care provider what activities are safe for you.  Do not lift  anything that is heavier than 10 lb (4.5 kg) until your health care provider tells you that it is safe.  Avoid activities that cause pain or make your condition worse.  Do exercises as told by your health care provider. General instructions  Take over-the-counter and prescription medicines only as told by your health care provider.  Do not drive or operate heavy machinery while taking prescription pain medicines.  Keep all follow-up visits as told by your health care provider. This is important. How is this prevented?  Warm up and stretch before being active.  Cool down and stretch after being active.  Give your body time to rest between periods of activity.  Make sure to use equipment that fits you.  Be safe and responsible while being active to avoid falls.  Do at least 150 minutes of moderate-intensity exercise each week, such as brisk walking or water aerobics.  Maintain physical fitness, including:  Strength.  Flexibility.  Cardiovascular fitness.  Endurance. Contact a health care provider if:  You have symptoms that get worse or do not get better after 2 weeks of treatment.  You develop new symptoms. Get help right away if:  You develop severe pain. This information is not intended to replace advice given to you by your health care provider. Make sure you discuss any questions you have with your health care provider. Document Released: 11/09/2005 Document Revised: 07/16/2016 Document Reviewed: 10/18/2015 Elsevier Interactive Patient Education  2017 [employer].

## 2017-01-07 NOTE — Progress Notes (Signed)
   Subjective:    I'm seeing this patient as a consultation for:  Dr. Beatrice Lecher  CC: Right elbow pain  HPI: This is a pleasant 61 year old male, he does hard labor for work. For the past several weeks he's had pain that he localizes in the anterior aspect of his right elbow, moderate, worsening, no trauma, no bruising. No paresthesias into the hand.  Cervical radiculopathy: Resolved with an epidural at an outside facility.  Past medical history:  Negative.  See flowsheet/record as well for more information.  Surgical history: Negative.  See flowsheet/record as well for more information.  Family history: Negative.  See flowsheet/record as well for more information.  Social history: Negative.  See flowsheet/record as well for more information.  Allergies, and medications have been entered into the medical record, reviewed, and no changes needed.   Review of Systems: No headache, visual changes, nausea, vomiting, diarrhea, constipation, dizziness, abdominal pain, skin rash, fevers, chills, night sweats, weight loss, swollen lymph nodes, body aches, joint swelling, muscle aches, chest pain, shortness of breath, mood changes, visual or auditory hallucinations.   Objective:   General: Well Developed, well nourished, and in no acute distress.  Neuro/Psych: Alert and oriented x3, extra-ocular muscles intact, able to move all 4 extremities, sensation grossly intact. Skin: Warm and dry, no rashes noted.  Respiratory: Not using accessory muscles, speaking in full sentences, trachea midline.  Cardiovascular: Pulses palpable, no extremity edema. Abdomen: Does not appear distended. Right Elbow: Unremarkable to inspection. Range of motion full pronation, supination, flexion, extension. Strength is full to all of the above directions Stable to varus, valgus stress. Negative moving valgus stress test. Tender to palpation at the distal biceps tendon, down to the radial tuberosity, reproduction  of pain with resisted supination. Ulnar nerve does not sublux. Negative cubital tunnel Tinel's.  Impression and Recommendations:   This case required medical decision making of moderate complexity.  Distal biceps tendinitis, right Classic right distal biceps tendinitis with reproduction of pain with resisted supination of the right hand. He does have good strength so I do not  suspect rupture or tear. Meloxicam, physical therapy, x-rays of the elbow, compressive elbow sleeve.  Return in 4 weeks, distal biceps injection under ultrasound guidance if insufficient relief. He will do restricted duty at work, no lifting greater than 20 pounds for the next 2 weeks.  Right cervical radiculopathy Had epidurals a couple of years ago at an outside facility with fantastic relief of his radiculopathy.

## 2017-01-08 ENCOUNTER — Encounter: Payer: Self-pay | Admitting: Sports Medicine

## 2017-01-08 NOTE — Progress Notes (Signed)
Letter written

## 2017-01-14 ENCOUNTER — Ambulatory Visit (INDEPENDENT_AMBULATORY_CARE_PROVIDER_SITE_OTHER): Payer: Managed Care, Other (non HMO) | Admitting: Physical Therapy

## 2017-01-14 DIAGNOSIS — M25521 Pain in right elbow: Secondary | ICD-10-CM

## 2017-01-14 DIAGNOSIS — M791 Myalgia, unspecified site: Secondary | ICD-10-CM

## 2017-01-14 DIAGNOSIS — R29898 Other symptoms and signs involving the musculoskeletal system: Secondary | ICD-10-CM

## 2017-01-14 NOTE — Therapy (Signed)
[facility] [place], [place], [address] Phone: [phone]   Fax:  [phone]  Physical Therapy Evaluation  Patient Details  Name: Mark Vazquez MRN: [id number] Date of Birth: 1956-09-06 Referring Provider: Silverio Decamp, MD  Encounter Date: 01/14/2017      PT End of Session - 01/14/17 0901    Visit Number 1   Number of Visits 12   Date for PT Re-Evaluation 02/25/17   Authorization Type [employer]et[employer]   PT Start Time 0815   PT Stop Time 0849   PT Time Calculation (min) 34 min   Activity Tolerance Patient tolerated treatment well   Behavior During Therapy [facility] for tasks assessed/performed      Past Medical History:  Diagnosis Date  . Hypertension     No past surgical history on file.  There were no vitals filed for this visit.       Subjective Assessment - 01/14/17 0816    Subjective Pt is a 61 y/o male who presents to OPPT with R elbow pain, especially with lifting heavy items.  Pt reports symptoms began about 5 months with no known injury.  Pt presents today with continued pain and difficulty with work responsibilities.   Pertinent History HTN   Limitations Lifting;House hold activities   Patient Stated Goals eliminate pain   Currently in Pain? Yes   Pain Score 0-No pain  up to 10, "I won't go to emergency room"   Pain Location Elbow   Pain Orientation Right   Pain Descriptors / Indicators Stabbing   Pain Type Chronic pain   Pain Onset More than a month ago   Pain Frequency Intermittent   Aggravating Factors  pulling motions, heavy forward lifting   Pain Relieving Factors avoiding painful activities            [facility] PT Assessment - 01/14/17 0821      Assessment   Medical Diagnosis R distal biceps tendonitis   Referring Provider Silverio Decamp, MD   Onset Date/Surgical Date --  5 months ago   Hand Dominance Right   Next MD Visit after PT   Prior Therapy 2 yrs ago for  back     Precautions   Precaution Comments no lifting > 20#     Restrictions   Weight Bearing Restrictions No     Balance Screen   Has the patient fallen in the past 6 months No   Has the patient had a decrease in activity level because of a fear of falling?  No   Is the patient reluctant to leave their home because of a fear of falling?  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#   [place]   Overall Cognitive Status Within Functional Limits for tasks assessed     Observation/Other Assessments   Focus on Therapeutic Outcomes (FOTO)  69 (31% limited; predicted 23% limited)     Posture/Postural Control   Posture/Postural Control Postural limitations   Postural Limitations Rounded Shoulders;Forward head     AROM   Overall AROM Comments RUE WNL     Strength   Strength Assessment Site Shoulder;Elbow;Forearm   Right/Left Shoulder Right;Left   Right Shoulder Flexion 5/5   Right Shoulder ABduction  5/5   Right Shoulder Internal Rotation 5/5   Right Shoulder External Rotation 5/5   Left Shoulder Flexion 5/5   Left Shoulder ABduction 5/5   Left Shoulder Internal Rotation 5/5   Left Shoulder External Rotation 5/5   Right/Left Elbow Right;Left   Right Elbow Flexion 4/5  with pain   Right Elbow Extension 5/5   Left Elbow Flexion 5/5   Left Elbow Extension 5/5   Right/Left Forearm Right;Left   Right Forearm Pronation 5/5   Right Forearm Supination 5/5  min pain   Left Forearm Pronation 5/5   Left Forearm Supination 5/5     Palpation   Palpation comment tenderness along distal biceps tendon, increased edema noted medial elbow                   [facility] Adult PT Treatment/Exercise - 01/14/17 0821      Modalities   Modalities Iontophoresis;Ultrasound      Ultrasound   Ultrasound Location R distal biceps tendon   Ultrasound Parameters 1.0 w/cm2, 72mHz freq, 100% DC x 8 min   Ultrasound Goals Pain;Edema     Iontophoresis   Type of Iontophoresis Dexamethasone   Location R distal biceps   Dose 1.0 ml   Time 6 hour patch                PT Education - 01/14/17 0900    Education provided Yes   Education Details ionto, clinical findings, POC, goals of care   Person(s) Educated Patient   Methods Explanation;Handout   Comprehension Verbalized understanding             PT Long Term Goals - 01/14/17 0908      PT LONG TERM GOAL #1   Title independent with HEP (02/25/17)   Time 6   Period Weeks   Status New     PT LONG TERM GOAL #2   Title demonstrate work simulated activities with proper technique to decrease reinjury risk (02/25/17)   Time 6   Period Weeks     PT LONG TERM GOAL #3   Title report ability to perform work activities with pain < 3/10 for improved function (02/25/17)   Time 6   Period Weeks   Status New     PT LONG TERM GOAL #4   Title no pain with MMT for improved tolerance and strength (02/25/17)   Time 6   Period Weeks   Status New               Plan - 01/14/17 0901    Clinical Impression Statement Pt is a 61 y/o male who presents to OPPT for low complexity PT eval of R distal biceps tendonitis.  Pt demonstrates mild strength deficits most likely due to pain, and pain affecting work responsibilities.  Feel injury is most likely due to overuse and will need to educate on work tasks to decrease risk of reinjury.  Pt very eager to get back to work, needing reinforcement not to rush recovery.  Will benefit from PT to address deficits.   Rehab Potential Excellent   PT Frequency 2x / week   PT Duration 6 weeks  anticipate d/c in 4-6 weeks   PT Treatment/Interventions ADLs/Self Care Home Management;Cryotherapy;Electrical Stimulation;Iontophoresis 4mg /ml Dexamethasone;Moist Heat;Ultrasound;Therapeutic  exercise;Functional mobility training;Patient/family education;Manual techniques;Dry needling;Taping   PT Next Visit Plan continue modalities for pain control, assess ionto response, manual, ?TDN, gentle stretching and strengthening exercises   Consulted and Agree with Plan of Care  Patient      Patient will benefit from skilled therapeutic intervention in order to improve the following deficits and impairments:  Pain, Impaired UE functional use, Decreased strength  Visit Diagnosis: Pain in right elbow - Plan: PT plan of care cert/re-cert  Other symptoms and signs involving the musculoskeletal system - Plan: PT plan of care cert/re-cert  Myalgia - Plan: PT plan of care cert/re-cert     Problem List Patient Active Problem List   Diagnosis Date Noted  . Distal biceps tendinitis, right 01/07/2017  . Allergic rhinitis due to allergen 11/24/2016  . GERD (gastroesophageal reflux disease) 03/27/2015  . Lipoma of skin of abdomen 12/12/2014  . Midline low back pain 12/11/2014  . Right cervical radiculopathy 02/22/2014  . Impingement syndrome of right shoulder region 02/22/2014      Laureen Abrahams, PT, DPT 01/14/17 9:12 AM    [facility] [place], [place], [address] Phone: [phone]   Fax:  [phone]  Name: Mark Vazquez MRN: [id number] Date of Birth: 1956-06-01

## 2017-01-14 NOTE — Patient Instructions (Signed)

## 2017-01-15 ENCOUNTER — Ambulatory Visit (INDEPENDENT_AMBULATORY_CARE_PROVIDER_SITE_OTHER): Payer: Managed Care, Other (non HMO) | Admitting: Physical Therapy

## 2017-01-15 DIAGNOSIS — M25521 Pain in right elbow: Secondary | ICD-10-CM | POA: Diagnosis not present

## 2017-01-15 DIAGNOSIS — R29898 Other symptoms and signs involving the musculoskeletal system: Secondary | ICD-10-CM | POA: Diagnosis not present

## 2017-01-15 NOTE — Therapy (Signed)
Dictation #1 [id number]  [id number] [facility] [place], [place], [address] Phone: [phone]   Fax:  [phone]  Physical Therapy Treatment  Patient Details  Name: Mark Vazquez MRN: [id number] Date of Birth: 10/09/1956 Referring Provider: Dr. Dianah Field  Encounter Date: 01/15/2017      PT End of Session - 01/15/17 1023    Visit Number 2   Number of Visits 12   Date for PT Re-Evaluation 02/25/17   Authorization Type Aetna   PT Start Time [phone]   PT Stop Time 1018   PT Time Calculation (min) 45 min   Activity Tolerance Patient tolerated treatment well   Behavior During Therapy [facility] for tasks assessed/performed      Past Medical History:  Diagnosis Date  . Hypertension     No past surgical history on file.  There were no vitals filed for this visit.      Subjective Assessment - 01/15/17 0937    Subjective Pt reports he is anxious to return to work, but knows he has to be 100% to return. He tolerated ionto patch well, without any skin irritation.  Pt presents with neoprene elbow sleeve he is to wear during day, excpt in therapy.    Currently in Pain? No/denies   Pain Score 0-No pain  only with heavy lifting, up to 20 out of 10   Pain Location Elbow   Pain Orientation Right            [facility] PT Assessment - 01/15/17 0001      Assessment   Medical Diagnosis R distal biceps tendonitis   Referring Provider Dr. Dulcy Fanny Dominance Right   Next MD Visit 01/13/17          [facility] Adult PT Treatment/Exercise - 01/15/17 0001      Exercises   Exercises Elbow;Wrist;Shoulder     Elbow Exercises   Elbow Flexion Strengthening;Right;10 reps;Seated;Theraband  2 sets, eccentric focus   Theraband Level (Elbow Flexion) Level 2 (Red);Level 3 (Green)   Forearm Supination 10 reps;Right;Strengthening;Seated;Bar weights/barbell  2 sets. 4#   Forearm Pronation Both;10 reps;Seated;Bar  weights/barbell  2 sets, 4#     Shoulder Exercises: Seated   Horizontal ABduction Strengthening;Both;10 reps;Theraband   Theraband Level (Shoulder Horizontal ABduction) Level 3 (Green)   External Rotation Strengthening;Both;10 reps;Theraband  2 sets   Theraband Level (Shoulder External Rotation) Level 3 (Green)     Shoulder Exercises: Standing   Other Standing Exercises counter push ups, elbows in x 5 reps      Shoulder Exercises: Stretch   Other Shoulder Stretches Rt tricep stretch x 30 sec x 2 sets.    Other Shoulder Stretches Rt bicep stretch x 4 reps (multiple attempts to have good form and stretch in proper area)     Wrist Exercises   Other wrist exercises wrist flex/ ext stretches RUE x 2 saets     Modalities   Modalities Iontophoresis;Ultrasound     Ultrasound   Ultrasound Location Rt distal bicep tendon   Ultrasound Parameters 1.0 w/cm2, 1.0 mHz, 8 min    Ultrasound Goals Pain     Iontophoresis   Type of Iontophoresis Dexamethasone   Location R distal biceps   Dose 1.0 ml   Time 12 hr patch, 120 mA.                PT Education - 01/15/17 [phone]    Education provided Yes   Education Details HEP  Person(s) Educated Patient   Methods Explanation;Handout;Demonstration;Verbal cues   Comprehension Verbalized understanding             PT Long Term Goals - 01/14/17 0908      PT LONG TERM GOAL #1   Title independent with HEP (02/25/17)   Time 6   Period Weeks   Status New     PT LONG TERM GOAL #2   Title demonstrate work simulated activities with proper technique to decrease reinjury risk (02/25/17)   Time 6   Period Weeks     PT LONG TERM GOAL #3   Title report ability to perform work activities with pain < 3/10 for improved function (02/25/17)   Time 6   Period Weeks   Status New     PT LONG TERM GOAL #4   Title no pain with MMT for improved tolerance and strength (02/25/17)   Time 6   Period Weeks   Status New               Plan -  01/15/17 1114    Clinical Impression Statement Pt tolerated all exercises well, with minimal to no pain. He also tolerated ionto without any skin irritation; repeated today.  He needed continued reinforcement to not rush recovery. No goals met yet; only 2nd visit.     Rehab Potential Excellent   PT Frequency 2x / week   PT Duration 6 weeks   PT Treatment/Interventions ADLs/Self Care Home Management;Cryotherapy;Electrical Stimulation;Iontophoresis 65m/ml Dexamethasone;Moist Heat;Ultrasound;Therapeutic exercise;Functional mobility training;Patient/family education;Manual techniques;Dry needling;Taping   PT Next Visit Plan continue modalities for pain control, ionto manual, gentle stretching and strengthening exercises   Consulted and Agree with Plan of Care Patient      Patient will benefit from skilled therapeutic intervention in order to improve the following deficits and impairments:  Pain, Impaired UE functional use, Decreased strength  Visit Diagnosis: Pain in right elbow  Other symptoms and signs involving the musculoskeletal system     Problem List Patient Active Problem List   Diagnosis Date Noted  . Distal biceps tendinitis, right 01/07/2017  . Allergic rhinitis due to allergen 11/24/2016  . GERD (gastroesophageal reflux disease) 03/27/2015  . Lipoma of skin of abdomen 12/12/2014  . Midline low back pain 12/11/2014  . Right cervical radiculopathy 02/22/2014  . Impingement syndrome of right shoulder region 02/22/2014   JKerin Perna PTA 01/15/17 11:26 AM  [address] [address]Phone: [phone]  Fax:  [phone] Name: Mark VilardiMRN: [id number] of Birth: 302-01-1956

## 2017-01-15 NOTE — Patient Instructions (Addendum)
Chest / Bicep Stretch    Lace fingers behind back and squeeze shoulder blades together. Slowly raise and straighten arms. Hold __30__ seconds. Repeat _3___ times per set. Do __1__ sets per session. Do _2-3___ sessions per day.  (Can hold towel if can't reach to lace fingers)  * can also hold doorframe, like in therapy.   Inferior Capsule Stretch (Passive)    Stand or sit, one hand behind head and down between scapulae as far as possible. With other hand, pull elbow toward head until stretch is felt in armpit. Hold _30__ seconds. Repeat _2__ times per session. Do ___ sessions per day.   Strengthening: Chest Pull - Resisted    With resistive band looped around each hand, and arms straight out in front, stretch band across chest. Repeat _10___ times per set. Do _2-3___ sets per session. Do __1__ sessions per day.  Resisted External Rotation: in Neutral - Bilateral   PALMS UP Sit or stand, tubing in both hands, elbows at sides, bent to 90, forearms forward. Pinch shoulder blades together and rotate forearms out. Keep elbows at sides. Repeat __10__ times per set. Do _2-3___ sets per session. Do _2-3___ sessions per day.  Forearm Supination / Pronation: Resisted    With _4___ pound object in right hand, slowly turn palm up, then down. Repeat __10__ times per set. Do ___2_ sets per session. Do __1__ sessions per day.  Arm Curl    Sit or stand with feet shoulder width apart, arms straight down at sides, palms forward. Inhale, then exhale while slowly curling weights toward shoulders and keeping elbows touching torso. Slowly return to starting position. Repeat __10__ times per set. Do ___2-3_ sets per session. Do __3__ sessions per week. May be done with dumbbells, tubing or resistive band.   [facility] [place], [place] [address]  [phone] (office) [phone] (fax)

## 2017-01-19 ENCOUNTER — Encounter: Payer: Self-pay | Admitting: Rehabilitative and Restorative Service Providers"

## 2017-01-19 ENCOUNTER — Ambulatory Visit (INDEPENDENT_AMBULATORY_CARE_PROVIDER_SITE_OTHER): Payer: Managed Care, Other (non HMO) | Admitting: Rehabilitative and Restorative Service Providers"

## 2017-01-19 DIAGNOSIS — M25521 Pain in right elbow: Secondary | ICD-10-CM | POA: Diagnosis not present

## 2017-01-19 DIAGNOSIS — R29898 Other symptoms and signs involving the musculoskeletal system: Secondary | ICD-10-CM

## 2017-01-19 DIAGNOSIS — M791 Myalgia, unspecified site: Secondary | ICD-10-CM

## 2017-01-19 NOTE — Patient Instructions (Signed)
Neurovascular: Median Nerve Glide With Cervical Bias - Supine    Lie with neck supported, right arm out to side, elbow straight, thumb down, fingers and wrist bent back. Slowly move opposite side ear toward shoulder as far as possible without pain. Hold 1 min Repeat _2___ times per set. Do __2__sessions per day  Trigger Point Dry Needling  . What is Trigger Point Dry Needling (DN)? o DN is a physical therapy technique used to treat muscle pain and dysfunction. Specifically, DN helps deactivate muscle trigger points (muscle knots).  o A thin filiform needle is used to penetrate the skin and stimulate the underlying trigger point. The goal is for a local twitch response (LTR) to occur and for the trigger point to relax. No medication of any kind is injected during the procedure.   . What Does Trigger Point Dry Needling Feel Like?  o The procedure feels different for each individual patient. Some patients report that they do not actually feel the needle enter the skin and overall the process is not painful. Very mild bleeding may occur. However, many patients feel a deep cramping in the muscle in which the needle was inserted. This is the local twitch response.   Marland Kitchen How Will I feel after the treatment? o Soreness is normal, and the onset of soreness may not occur for a few hours. Typically this soreness does not last longer than two days.  o Bruising is uncommon, however; ice can be used to decrease any possible bruising.  o In rare cases feeling tired or nauseous after the treatment is normal. In addition, your symptoms may get worse before they get better, this period will typically not last longer than 24 hours.   . What Can I do After My Treatment? o Increase your hydration by drinking more water for the next 24 hours. o You may place ice or heat on the areas treated that have become sore, however, do not use heat on inflamed or bruised areas. Heat often brings more relief post  needling. o You can continue your regular activities, but vigorous activity is not recommended initially after the treatment for 24 hours. o DN is best combined with other physical therapy such as strengthening, stretching, and other therapies.

## 2017-01-19 NOTE — Therapy (Addendum)
[facility] [place], [place], [address] Phone: [phone]   Fax:  [phone]  Physical Therapy Treatment  Patient Details  Name: Mark Vazquez MRN: [id number] Date of Birth: 1956/06/23 Referring Provider: Dr. Dianah Field  Encounter Date: 01/19/2017      PT End of Session - 01/19/17 0850    Visit Number 3   Number of Visits 12   Date for PT Re-Evaluation 02/25/17   PT Start Time [id number]   PT Stop Time 0938   PT Time Calculation (min) 51 min   Activity Tolerance Patient tolerated treatment well      Past Medical History:  Diagnosis Date  . Hypertension     History reviewed. No pertinent surgical history.  There were no vitals filed for this visit.      Subjective Assessment - 01/19/17 0851    Subjective Patient reports that he is doing OK. He is working on his exercises at home. No problem with the ionto patch. Continues to wear the neoprene sleeve.    Currently in Pain? No/denies                         [facility] Adult PT Treatment/Exercise - 01/19/17 0001      Elbow Exercises   Elbow Flexion Strengthening;Both;10 reps;Supine  3 #      Shoulder Exercises: ROM/Strengthening   UBE (Upper Arm Bike) L2 x 4 min alt fwd/back      Shoulder Exercises: Stretch   Other Shoulder Stretches Rt tricep stretch x 30 sec x 2 sets.    Other Shoulder Stretches Rt biceps stretch 30 sec x 4      Wrist Exercises   Other wrist exercises wrist flex/ ext stretches RUE x 2 saets   Other wrist exercises supine nerve stretch Rt 1 min x 2      Modalities   Modalities Iontophoresis;Ultrasound     Moist Heat Therapy   Number Minutes Moist Heat 10 Minutes   Moist Heat Location Elbow  Rt     Ultrasound   Ultrasound Location Rt distal biceps tendon area   Ultrasound Parameters 1.0 w/cm2; 1 mHz; 100%; 8 min    Ultrasound Goals Pain     Iontophoresis   Type of Iontophoresis Dexamethasone   Location R distal biceps   Dose 1.0 ml   Time 12 hr patch, 120 mA.     Manual Therapy   Manual therapy comments pt supine    Soft tissue mobilization Rt biceps; wrist flexors at medial epicondyle    Myofascial Release elbow area           Trigger Point Dry Needling - 01/19/17 0924    Consent Given? Yes   Education Handout Provided Yes   Muscles Treated Upper Body --  biceps/flexor carpi ulnarius Rt - dec tenderness/palpation              PT Education - 01/19/17 [id number]    Education provided Yes   Education Details HEP; DN   Person(s) Educated Patient   Methods Explanation;Demonstration;Tactile cues;Verbal cues;Handout   Comprehension Verbalized understanding;Returned demonstration;Verbal cues required;Tactile cues required             PT Long Term Goals - 01/19/17 0850      PT LONG TERM GOAL #1   Title independent with HEP (02/25/17)   Time 6   Period Weeks   Status On-going     PT LONG TERM GOAL #2  Title demonstrate work simulated activities with proper technique to decrease reinjury risk (02/25/17)   Time 6   Period Weeks   Status On-going     PT LONG TERM GOAL #3   Title report ability to perform work activities with pain < 3/10 for improved function (02/25/17)   Time 6   Period Weeks   Status On-going     PT LONG TERM GOAL #4   Title no pain with MMT for improved tolerance and strength (02/25/17)   Time 6   Period Weeks   Status On-going               Plan - 01/19/17 [id number]    Clinical Impression Statement Continued tightness through Rt biceps - noted tightness flexor carpi ulnarius as well. Tolerated DN fairly well for first time. May benefit from additional treatment. Added nerve stretch supine due to (+) neural tension test Rt compared to Lt. Continues to require PT to address problems identified.     Rehab Potential Excellent   PT Frequency 2x / week   PT Duration 6 weeks   PT Treatment/Interventions ADLs/Self Care Home  Management;Cryotherapy;Electrical Stimulation;Iontophoresis 4mg /ml Dexamethasone;Moist Heat;Ultrasound;Therapeutic exercise;Functional mobility training;Patient/family education;Manual techniques;Dry needling;Taping   PT Next Visit Plan continue modalities for pain control, ionto manual, gentle stretching and strengthening exercises; assess response to DN    Consulted and Agree with Plan of Care Patient      Patient will benefit from skilled therapeutic intervention in order to improve the following deficits and impairments:  Pain, Impaired UE functional use, Decreased strength, Increased fascial restricitons, Increased muscle spasms  Visit Diagnosis: Pain in right elbow  Other symptoms and signs involving the musculoskeletal system  Myalgia     Problem List Patient Active Problem List   Diagnosis Date Noted  . Distal biceps tendinitis, right 01/07/2017  . Allergic rhinitis due to allergen 11/24/2016  . GERD (gastroesophageal reflux disease) 03/27/2015  . Lipoma of skin of abdomen 12/12/2014  . Midline low back pain 12/11/2014  . Right cervical radiculopathy 02/22/2014  . Impingement syndrome of right shoulder region 02/22/2014    Latronda Spink Nilda Simmer PT, MPH  01/19/2017, 9:39 AM  [facility] [place], [place], [address] Phone: [phone]   Fax:  [phone]  Name: Mark Vazquez MRN: [id number] Date of Birth: 27-Apr-1956

## 2017-01-21 ENCOUNTER — Encounter: Payer: Self-pay | Admitting: Sports Medicine

## 2017-01-21 ENCOUNTER — Ambulatory Visit (INDEPENDENT_AMBULATORY_CARE_PROVIDER_SITE_OTHER): Payer: Managed Care, Other (non HMO) | Admitting: Sports Medicine

## 2017-01-21 ENCOUNTER — Ambulatory Visit (INDEPENDENT_AMBULATORY_CARE_PROVIDER_SITE_OTHER): Payer: Managed Care, Other (non HMO) | Admitting: Physical Therapy

## 2017-01-21 DIAGNOSIS — R29898 Other symptoms and signs involving the musculoskeletal system: Secondary | ICD-10-CM

## 2017-01-21 DIAGNOSIS — M791 Myalgia, unspecified site: Secondary | ICD-10-CM

## 2017-01-21 DIAGNOSIS — M25521 Pain in right elbow: Secondary | ICD-10-CM

## 2017-01-21 DIAGNOSIS — M7521 Bicipital tendinitis, right shoulder: Secondary | ICD-10-CM | POA: Diagnosis not present

## 2017-01-21 NOTE — Progress Notes (Signed)
  Subjective:    CC: Follow-up  HPI: I saw this pleasant 61 year old male 2 weeks ago, we diagnosed him with a classic right bicipital tendinitis, he had pain with resisted supination of the right elbow, he has done several sessions of aggressive formal physical therapy, elbow compression sleeve and returns today 100% pain free. Happy with his results and he desires to go back to work  Past medical history:  Negative.  See flowsheet/record as well for more information.  Surgical history: Negative.  See flowsheet/record as well for more information.  Family history: Negative.  See flowsheet/record as well for more information.  Social history: Negative.  See flowsheet/record as well for more information.  Allergies, and medications have been entered into the medical record, reviewed, and no changes needed.   Review of Systems: No fevers, chills, night sweats, weight loss, chest pain, or shortness of breath.   Objective:    General: Well Developed, well nourished, and in no acute distress.  Neuro: Alert and oriented x3, extra-ocular muscles intact, sensation grossly intact.  HEENT: Normocephalic, atraumatic, pupils equal round reactive to light, neck supple, no masses, no lymphadenopathy, thyroid nonpalpable.  Skin: Warm and dry, no rashes. Cardiac: Regular rate and rhythm, no murmurs rubs or gallops, no lower extremity edema.  Respiratory: Clear to auscultation bilaterally. Not using accessory muscles, speaking in full sentences. Right Elbow: Unremarkable to inspection. Iontophoresis patch is in place currently. Range of motion full pronation, supination, flexion, extension. Strength is full to all of the above directions Stable to varus, valgus stress. Negative moving valgus stress test. No discrete areas of tenderness to palpation. Ulnar nerve does not sublux. Negative cubital tunnel Tinel's. Specifically full strength and range of motion without pain to resisted  supination.  Impression and Recommendations:    Distal biceps tendinitis, right 100 percent resolved after physical therapy with iontophoresis, dry needling. He will continue the elbow sleeve, and continue the rehabilitation exercises. I'm going to write a letter to get him back in to work without restrictions. He had excellent strength to supination today without pain.

## 2017-01-21 NOTE — Therapy (Addendum)
[place], [place], [address] Phone: [phone]   Fax:  [phone]  Physical Therapy Treatment  Patient Details  Name: Mark Vazquez MRN: [id number] Date of Birth: 1955-12-12 Referring Provider: Dr. Dianah Field  Encounter Date: 01/21/2017      PT End of Session - 01/21/17 0935    Visit Number 4   Number of Visits 12   Date for PT Re-Evaluation 02/25/17   Authorization Type [employer]   PT Start Time 0935   PT Stop Time 1017   PT Time Calculation (min) 42 min   Activity Tolerance Patient tolerated treatment well   Behavior During Therapy [facility] for tasks assessed/performed      Past Medical History:  Diagnosis Date  . Hypertension     No past surgical history on file.  There were no vitals filed for this visit.      Subjective Assessment - 01/21/17 0940    Subjective Pt reports he feels like things are getting better.   Currently in Pain? No/denies                         [facility] Adult PT Treatment/Exercise - 01/21/17 0935      Elbow Exercises   Elbow Flexion Right;Strengthening;10 reps;Seated  3 sets   Bar Weights/Barbell (Elbow Flexion) 4 lbs   Elbow Flexion Limitations 1 set each - supination, neutral forearm, pronation   Forearm Supination Right;10 reps;Seated;Bar weights/barbell   Forearm Supination Limitations 5#   Forearm Pronation Right;10 reps;Seated;Bar weights/barbell   Forearm Pronation Limitations 5#     Shoulder Exercises: Standing   Horizontal ABduction Both;15 reps;Theraband   Theraband Level (Shoulder Horizontal ABduction) Level 3 (Green)   Horizontal ABduction Limitations standing with back against inside of door frame   External Rotation Both;15 reps;Theraband   Theraband Level (Shoulder External Rotation) Level 3 (Green)   External Rotation Limitations standing with back against inside of door frame   Other Standing Exercises counter push ups,  elbows in x 10 reps   Other Standing Exercises alt B shoulder flexion/extension with green TBstanding with back against inside of door frame x15     Shoulder Exercises: ROM/Strengthening   UBE (Upper Arm Bike) L2 x 6 min alt fwd/back      Shoulder Exercises: Stretch   Other Shoulder Stretches Rt tricep stretch x 30 sec x 2 sets.    Other Shoulder Stretches Rt biceps stretch 30 sec x 4      Wrist Exercises   Other wrist exercises wrist flex/ ext stretches RUE x 2 saets   Other wrist exercises supine nerve stretch Rt 1 min x 2      Modalities   Modalities Iontophoresis;Ultrasound     Ultrasound   Ultrasound Location Rt distal biceps tendon area   Ultrasound Parameters 1.0 w/cm2; 1 mHz; 100%; 8 min    Ultrasound Goals Pain     Iontophoresis   Type of Iontophoresis Dexamethasone   Location R distal biceps   Dose 1.0 ml   Time 12 hr patch, 120 mA.     Manual Therapy   Soft tissue mobilization Rt biceps; wrist flexors at medial epicondyle    Myofascial Release elbow area                      PT Long Term Goals - 01/19/17 0850      PT LONG TERM GOAL #1   Title independent with  HEP (02/25/17)   Time 6   Period Weeks   Status On-going     PT LONG TERM GOAL #2   Title demonstrate work simulated activities with proper technique to decrease reinjury risk (02/25/17)   Time 6   Period Weeks   Status On-going     PT LONG TERM GOAL #3   Title report ability to perform work activities with pain < 3/10 for improved function (02/25/17)   Time 6   Period Weeks   Status On-going     PT LONG TERM GOAL #4   Title no pain with MMT for improved tolerance and strength (02/25/17)   Time 6   Period Weeks   Status On-going               Plan - 01/21/17 6144    Clinical Impression Statement Pt stating arm is feeling better and wanting to progress resistance to black theraband. Cautioned pt not to overdo to avoid flare-up of inflammation, and that exercises would be  gradually progressed as appropriate.    Rehab Potential Excellent   PT Frequency 2x / week   PT Duration 6 weeks   PT Treatment/Interventions ADLs/Self Care Home Management;Cryotherapy;Electrical Stimulation;Iontophoresis 60m/ml Dexamethasone;Moist Heat;Ultrasound;Therapeutic exercise;Functional mobility training;Patient/family education;Manual techniques;Dry needling;Taping   PT Next Visit Plan continue modalities for pain control, ionto manual, gentle stretching and strengthening exercises; assess response to DN    Consulted and Agree with Plan of Care Patient      Patient will benefit from skilled therapeutic intervention in order to improve the following deficits and impairments:  Pain, Impaired UE functional use, Decreased strength, Increased fascial restricitons, Increased muscle spasms  Visit Diagnosis: Pain in right elbow  Other symptoms and signs involving the musculoskeletal system  Myalgia     Problem List Patient Active Problem List   Diagnosis Date Noted  . Distal biceps tendinitis, right 01/07/2017  . Allergic rhinitis due to allergen 11/24/2016  . GERD (gastroesophageal reflux disease) 03/27/2015  . Lipoma of skin of abdomen 12/12/2014  . Midline low back pain 12/11/2014  . Right cervical radiculopathy 02/22/2014  . Impingement syndrome of right shoulder region 02/22/2014    JPercival Spanish PT, MPT 01/21/2017, 12:18 PM  [facility] [address]Phone: [phone]  Fax:  [phone] Name: Mark MatheyMRN: [id number] of Birth: 312/05/1956    PHYSICAL THERAPY DISCHARGE SUMMARY  Visits from Start of Care: 4  Current functional level related to goals / functional outcomes: See above   Remaining deficits: unknown   Education / Equipment: HEP  Plan: Patient agrees to discharge.  Patient goals were not met. Patient is being discharged due to not returning  since the last visit.  ?????     SLaureen Abrahams PT, DPT 03/05/17 1:06 PM  [facility] [address] [address] [phone](office) [phone](fax)

## 2017-01-21 NOTE — Assessment & Plan Note (Signed)
100 percent resolved after physical therapy with iontophoresis, dry needling. He will continue the elbow sleeve, and continue the rehabilitation exercises. I'm going to write a letter to get him back in to work without restrictions. He had excellent strength to supination today without pain.

## 2017-01-27 ENCOUNTER — Encounter: Payer: Managed Care, Other (non HMO) | Admitting: Rehabilitative and Restorative Service Providers"

## 2017-01-29 ENCOUNTER — Encounter: Payer: Managed Care, Other (non HMO) | Admitting: Rehabilitative and Restorative Service Providers"

## 2017-08-27 ENCOUNTER — Other Ambulatory Visit: Payer: Self-pay | Admitting: Cardiology

## 2017-08-27 DIAGNOSIS — R079 Chest pain, unspecified: Secondary | ICD-10-CM

## 2017-09-01 ENCOUNTER — Encounter (HOSPITAL_COMMUNITY): Payer: Self-pay

## 2017-09-01 ENCOUNTER — Ambulatory Visit (HOSPITAL_COMMUNITY): Payer: Managed Care, Other (non HMO) | Attending: Cardiology

## 2017-09-08 ENCOUNTER — Emergency Department (HOSPITAL_COMMUNITY)
Admission: EM | Admit: 2017-09-08 | Discharge: 2017-09-08 | Disposition: A | Discharge status: Home / Self Care | Payer: Self-pay | Attending: Emergency Medicine | Admitting: Emergency Medicine

## 2017-09-08 ENCOUNTER — Encounter (HOSPITAL_COMMUNITY): Payer: Self-pay | Admitting: *Deleted

## 2017-09-08 ENCOUNTER — Emergency Department (HOSPITAL_COMMUNITY): Payer: Self-pay

## 2017-09-08 DIAGNOSIS — Z87891 Personal history of nicotine dependence: Secondary | ICD-10-CM | POA: Insufficient documentation

## 2017-09-08 DIAGNOSIS — I25110 Atherosclerotic heart disease of native coronary artery with unstable angina pectoris: Secondary | ICD-10-CM | POA: Insufficient documentation

## 2017-09-08 DIAGNOSIS — Z955 Presence of coronary angioplasty implant and graft: Secondary | ICD-10-CM | POA: Insufficient documentation

## 2017-09-08 DIAGNOSIS — R091 Pleurisy: Secondary | ICD-10-CM | POA: Insufficient documentation

## 2017-09-08 DIAGNOSIS — I10 Essential (primary) hypertension: Secondary | ICD-10-CM | POA: Insufficient documentation

## 2017-09-08 DIAGNOSIS — Z79899 Other long term (current) drug therapy: Secondary | ICD-10-CM | POA: Insufficient documentation

## 2017-09-08 DIAGNOSIS — Z7982 Long term (current) use of aspirin: Secondary | ICD-10-CM | POA: Insufficient documentation

## 2017-09-08 LAB — BASIC METABOLIC PANEL
Anion gap: 6 (ref 5–15)
BUN: 11 mg/dL (ref 6–20)
CO2: 28 mmol/L (ref 22–32)
Calcium: 9.2 mg/dL (ref 8.9–10.3)
Chloride: 103 mmol/L (ref 101–111)
Creatinine, Ser: 1.27 mg/dL — ABNORMAL HIGH (ref 0.61–1.24)
GFR calc Af Amer: >60 mL/min (ref >60)
GFR calc non Af Amer: 60 mL/min — ABNORMAL LOW (ref >60)
Glucose, Bld: 123 mg/dL — ABNORMAL HIGH (ref 65–99)
Potassium: 3.7 mmol/L (ref 3.5–5.1)
Sodium: 137 mmol/L (ref 135–145)

## 2017-09-08 LAB — URINALYSIS, ROUTINE W REFLEX MICROSCOPIC
Bilirubin Urine: NEGATIVE
Glucose, UA: NEGATIVE mg/dL
Hgb urine dipstick: NEGATIVE
Ketones, ur: NEGATIVE mg/dL
Leukocytes, UA: NEGATIVE
Nitrite: NEGATIVE
Protein, ur: NEGATIVE mg/dL
Specific Gravity, Urine: 1.003 — ABNORMAL LOW (ref 1.005–1.030)
pH: 7 (ref 5.0–8.0)

## 2017-09-08 LAB — CBC
HCT: 45.6 % (ref 39.0–52.0)
Hemoglobin: 15.5 g/dL (ref 13.0–17.0)
MCH: 29.3 pg (ref 26.0–34.0)
MCHC: 34 g/dL (ref 30.0–36.0)
MCV: 86.2 fL (ref 78.0–100.0)
Platelets: 248 10*3/uL (ref 150–400)
RBC: 5.29 MIL/uL (ref 4.22–5.81)
RDW: 13.4 % (ref 11.5–15.5)
WBC: 8 10*3/uL (ref 4.0–10.5)

## 2017-09-08 LAB — I-STAT TROPONIN, ED
Troponin i, poc: 0 ng/mL (ref 0.00–0.08)
Troponin i, poc: 0 ng/mL (ref 0.00–0.08)

## 2017-09-08 MED ORDER — NAPROXEN 500 MG PO TABS: 500.0000 mg | ORAL_TABLET | Freq: Two times a day (BID) | ORAL | 0 refills | Status: AC

## 2017-09-08 NOTE — Discharge Instructions (Signed)
Naproxen daily for continued pain.  Recheck with pcp as needed. ER with any worsening

## 2017-09-08 NOTE — ED Notes (Signed)
Pt stable,ambulatory, and verbalizes understanding of D/C instructions.   

## 2017-09-08 NOTE — ED Triage Notes (Signed)
Pt reports onset yesterday of intermittent sharp left side chest pains. Denies recent cough or sob. No acute distress is noted at triage.

## 2017-09-08 NOTE — ED Provider Notes (Signed)
[facility] EMERGENCY DEPARTMENT Provider Note   CSN: [id number] Arrival date & time: 09/08/17  1346     History   Chief Complaint Chief Complaint  Patient presents with  . Chest Pain    HPI Mark Vazquez is a 61 y.o. male.chief complaint is left lower anterior chest pain.  HPI Mark Vazquez is 23. Has history of known coronary artery disease with 2 stents. He describes one "smaller" that was not amenable to PTCA. He describes "small vessels that feed into it". He is describing collaterals. Nonetheless, has known disease. Functions well. Sees cardiology every year. Exercises without symptoms.  For the last week intermittently he's had episodes of sharp sudden left lower anterior chest pain. He states it feels like a catch. Then it hurt for a few breaths and goes away. It never lasts for more than a few seconds at a time. Does not feel at all like symptoms he had when he had his heart disease. No risks for DVT or PE. No leg swelling. No cough or fever. No injury.  Past Medical History:  Diagnosis Date  . Hypertension     Patient Active Problem List   Diagnosis Date Noted  . Distal biceps tendinitis, right 01/07/2017  . Allergic rhinitis due to allergen 11/24/2016  . GERD (gastroesophageal reflux disease) 03/27/2015  . Lipoma of skin of abdomen 12/12/2014  . Midline low back pain 12/11/2014  . Right cervical radiculopathy 02/22/2014  . Impingement syndrome of right shoulder region 02/22/2014    History reviewed. No pertinent surgical history.     Home Medications    Prior to Admission medications   Medication Sig Start Date End Date Taking? Authorizing Provider  aspirin 81 MG tablet Take 81 mg by mouth daily.   Yes [provider]  atorvastatin (LIPITOR) 20 MG tablet Take 20 mg by mouth at bedtime.  05/28/16  Yes [provider]  carvedilol (COREG) 6.25 MG tablet Take by mouth.   Yes [provider]  fexofenadine (ALLEGRA) 180  MG tablet Take 1 tablet (180 mg total) by mouth daily. Patient taking differently: Take 180 mg by mouth as needed.  06/16/16  Yes Emeterio Reeve, DO  losartan-hydrochlorothiazide (HYZAAR) 50-12.5 MG tablet Take 1 tablet by mouth daily.   Yes [provider]  nitroGLYCERIN (NITROSTAT) 0.4 MG SL tablet  06/03/16  Yes [provider]  pantoprazole (PROTONIX) 40 MG tablet Take 1 tablet (40 mg total) by mouth daily. 05/20/16  Yes Hommel, Sean, DO  amoxicillin-clavulanate (AUGMENTIN) 875-125 MG tablet Take 1 tablet by mouth 2 (two) times daily. Patient not taking: Reported on 09/08/2017 11/24/16   Hali Marry, MD  meloxicam (MOBIC) 15 MG tablet One tab PO qAM with breakfast for 2 weeks, then daily prn pain. Patient not taking: Reported on 09/08/2017 01/07/17   Silverio Decamp, MD  naproxen (NAPROSYN) 500 MG tablet Take 1 tablet (500 mg total) by mouth 2 (two) times daily. 09/08/17   Tanna Furry, MD  predniSONE (DELTASONE) 20 MG tablet Take 2 tablets (40 mg total) by mouth daily with breakfast. Patient not taking: Reported on 09/08/2017 11/26/16   Hali Marry, MD  valsartan-hydrochlorothiazide (DIOVAN-HCT) 160-12.5 MG tablet Take by mouth.    [provider]    Family History Family History  Problem Relation Age of Onset  . Hyperlipidemia Unknown   . Hypertension Unknown     Social History Social History  Substance Use Topics  . Smoking status: Former Smoker  Quit date: 12/11/2008  . Smokeless tobacco: Never Used  . Alcohol use Not on file     Allergies   Shellfish allergy   Review of Systems Review of Systems  Constitutional: Negative for appetite change, chills, diaphoresis, fatigue and fever.  HENT: Negative for mouth sores, sore throat and trouble swallowing.   Eyes: Negative for visual disturbance.  Respiratory: Negative for cough, chest tightness, shortness of breath and wheezing.   Cardiovascular: Positive for chest pain.    Gastrointestinal: Negative for abdominal distention, abdominal pain, diarrhea, nausea and vomiting.  Endocrine: Negative for polydipsia, polyphagia and polyuria.  Genitourinary: Negative for dysuria, frequency and hematuria.  Musculoskeletal: Negative for gait problem.  Skin: Negative for color change, pallor and rash.  Neurological: Negative for dizziness, syncope, light-headedness and headaches.  Hematological: Does not bruise/bleed easily.  Psychiatric/Behavioral: Negative for behavioral problems and confusion.     Physical Exam Updated Vital Signs BP 118/78   Pulse 63   Temp 98.1 F (36.7 C) (Oral)   Resp 15   SpO2 99%   Physical Exam  Constitutional: He is oriented to person, place, and time. He appears well-developed and well-nourished. No distress.  HENT:  Head: Normocephalic.  Eyes: Pupils are equal, round, and reactive to light. Conjunctivae are normal. No scleral icterus.  Neck: Normal range of motion. Neck supple. No thyromegaly present.  Cardiovascular: Normal rate and regular rhythm.  Exam reveals no gallop and no friction rub.   No murmur heard. No pleural, or pericardial rubs. Clear breath sounds. Afebrile.  Pulmonary/Chest: Effort normal and breath sounds normal. No respiratory distress. He has no wheezes. He has no rales.  Abdominal: Soft. Bowel sounds are normal. He exhibits no distension. There is no tenderness. There is no rebound.  Musculoskeletal: Normal range of motion.  Neurological: He is alert and oriented to person, place, and time.  Skin: Skin is warm and dry. No rash noted.  Psychiatric: He has a normal mood and affect. His behavior is normal.     ED Treatments / Results  Labs (all labs ordered are listed, but only abnormal results are displayed) Labs Reviewed  BASIC METABOLIC PANEL - Abnormal; Notable for the following:       Result Value   Glucose, Bld 123 (*)    Creatinine, Ser 1.27 (*)    GFR calc non Af Amer 60 (*)    All other  components within normal limits  URINALYSIS, ROUTINE W REFLEX MICROSCOPIC - Abnormal; Notable for the following:    Color, Urine COLORLESS (*)    Specific Gravity, Urine 1.003 (*)    All other components within normal limits  CBC  I-STAT TROPONIN, ED  I-STAT TROPONIN, ED    EKG  EKG Interpretation  Date/Time:  Wednesday September 08 2017 13:57:10 EDT Ventricular Rate:  88 PR Interval:  178 QRS Duration: 80 QT Interval:  364 QTC Calculation: 440 R Axis:   39 Text Interpretation:  Normal sinus rhythm Nonspecific T wave abnormality Abnormal ECG Confirmed by Tanna Furry [phone]) on 09/08/2017 4:09:36 PM       Radiology Dg Chest 2 View  Result Date: 09/08/2017 CLINICAL DATA:  Pt reports intermittent "pinching" to the left side of his chest x 1 week. Denies recent cough or sob. Hx of HTN. Former smoker EXAM: CHEST  2 VIEW COMPARISON:  None. FINDINGS: Left hemidiaphragm eventration. Midline trachea. Normal heart size and mediastinal contours. No pleural effusion or pneumothorax. Clear lungs. IMPRESSION: No acute cardiopulmonary disease. Electronically Signed  By: Abigail Miyamoto M.D.   On: 09/08/2017 14:52    Procedures Procedures (including critical care time)  Medications Ordered in ED Medications - No data to display   Initial Impression / Assessment and Plan / ED Course  I have reviewed the triage vital signs and the nursing notes.  Pertinent labs & imaging results that were available during my care of the patient were reviewed by me and considered in my medical decision making (see chart for details).    69-year-old with known coronary artery disease. Intermittent chest pain does not sound cardiac. No risks for PE. No hypoxemia, or tachycardia. Per As well as negative with exception only of age. Normal x-ray. Delta troponin pending. If negative would be appropriate for discharge home. May need intermittent anti-inflammatories for pleurisy  Final Clinical Impressions(s) / ED  Diagnoses   Final diagnoses:  Pleurisy   Delta troponin negative. Plan limited number of 10 daily naproxen for pain. Primary care follow-up.   New Prescriptions New Prescriptions   NAPROXEN (NAPROSYN) 500 MG TABLET    Take 1 tablet (500 mg total) by mouth 2 (two) times daily.     Tanna Furry, MD 09/08/17 Vernelle Emerald

## 2018-01-14 ENCOUNTER — Other Ambulatory Visit: Payer: Self-pay | Admitting: Cardiology

## 2018-01-14 DIAGNOSIS — R079 Chest pain, unspecified: Secondary | ICD-10-CM

## 2018-01-24 ENCOUNTER — Encounter (HOSPITAL_COMMUNITY): Payer: Self-pay

## 2018-01-24 ENCOUNTER — Ambulatory Visit (HOSPITAL_COMMUNITY)
Admission: RE | Admit: 2018-01-24 | Discharge: 2018-01-24 | Disposition: A | Discharge status: Home / Self Care | Payer: BLUE CROSS/BLUE SHIELD | Source: Ambulatory Visit | Attending: Cardiology | Admitting: Cardiology

## 2018-01-24 DIAGNOSIS — R079 Chest pain, unspecified: Secondary | ICD-10-CM | POA: Diagnosis not present

## 2018-01-24 MED ORDER — TECHNETIUM TC 99M TETROFOSMIN IV KIT
30.0000 | PACK | Freq: Once | INTRAVENOUS | Status: AC | PRN
  Administered 2018-01-24: 30 via INTRAVENOUS

## 2018-01-24 MED ORDER — TECHNETIUM TC 99M TETROFOSMIN IV KIT
10.0000 | PACK | Freq: Once | INTRAVENOUS | Status: AC | PRN
  Administered 2018-01-24: 10 via INTRAVENOUS

## 2020-10-21 ENCOUNTER — Emergency Department (HOSPITAL_COMMUNITY): Payer: Worker's Compensation

## 2020-10-21 ENCOUNTER — Emergency Department (HOSPITAL_COMMUNITY)
Admission: EM | Admit: 2020-10-21 | Discharge: 2020-10-21 | Disposition: A | Discharge status: Home / Self Care | Payer: Worker's Compensation | Attending: Emergency Medicine | Admitting: Emergency Medicine

## 2020-10-21 ENCOUNTER — Encounter (HOSPITAL_COMMUNITY): Payer: Self-pay | Admitting: Emergency Medicine

## 2020-10-21 DIAGNOSIS — T1490XA Injury, unspecified, initial encounter: Secondary | ICD-10-CM

## 2020-10-21 DIAGNOSIS — Z20822 Contact with and (suspected) exposure to covid-19: Secondary | ICD-10-CM | POA: Diagnosis not present

## 2020-10-21 DIAGNOSIS — Y990 Civilian activity done for income or pay: Secondary | ICD-10-CM | POA: Diagnosis not present

## 2020-10-21 DIAGNOSIS — S83125A Posterior dislocation of proximal end of tibia, left knee, initial encounter: Secondary | ICD-10-CM | POA: Diagnosis not present

## 2020-10-21 DIAGNOSIS — S83104A Unspecified dislocation of right knee, initial encounter: Secondary | ICD-10-CM

## 2020-10-21 DIAGNOSIS — S8991XA Unspecified injury of right lower leg, initial encounter: Secondary | ICD-10-CM | POA: Diagnosis present

## 2020-10-21 DIAGNOSIS — I10 Essential (primary) hypertension: Secondary | ICD-10-CM | POA: Diagnosis not present

## 2020-10-21 DIAGNOSIS — W228XXA Striking against or struck by other objects, initial encounter: Secondary | ICD-10-CM | POA: Diagnosis not present

## 2020-10-21 LAB — CBC
HCT: 45.6 % (ref 39.0–52.0)
Hemoglobin: 15.1 g/dL (ref 13.0–17.0)
MCH: 29.4 pg (ref 26.0–34.0)
MCHC: 33.1 g/dL (ref 30.0–36.0)
MCV: 88.7 fL (ref 80.0–100.0)
Platelets: 239 10*3/uL (ref 150–400)
RBC: 5.14 MIL/uL (ref 4.22–5.81)
RDW: 13.3 % (ref 11.5–15.5)
WBC: 13 10*3/uL — ABNORMAL HIGH (ref 4.0–10.5)
nRBC: 0 % (ref 0.0–0.2)

## 2020-10-21 LAB — I-STAT CHEM 8, ED
BUN: 11 mg/dL (ref 8–23)
Calcium, Ion: 1.1 mmol/L — ABNORMAL LOW (ref 1.15–1.40)
Chloride: 100 mmol/L (ref 98–111)
Creatinine, Ser: 1.4 mg/dL — ABNORMAL HIGH (ref 0.61–1.24)
Glucose, Bld: 97 mg/dL (ref 70–99)
HCT: 47 % (ref 39.0–52.0)
Hemoglobin: 16 g/dL (ref 13.0–17.0)
Potassium: 3.5 mmol/L (ref 3.5–5.1)
Sodium: 139 mmol/L (ref 135–145)
TCO2: 24 mmol/L (ref 22–32)

## 2020-10-21 LAB — TYPE AND SCREEN
ABO/RH(D): A POS
Antibody Screen: NEGATIVE

## 2020-10-21 LAB — ETHANOL: Alcohol, Ethyl (B): <10 mg/dL (ref <10)

## 2020-10-21 LAB — COMPREHENSIVE METABOLIC PANEL
ALT: 33 U/L (ref 0–44)
AST: 25 U/L (ref 15–41)
Albumin: 3.8 g/dL (ref 3.5–5.0)
Alkaline Phosphatase: 62 U/L (ref 38–126)
Anion gap: 10 (ref 5–15)
BUN: 10 mg/dL (ref 8–23)
CO2: 27 mmol/L (ref 22–32)
Calcium: 9 mg/dL (ref 8.9–10.3)
Chloride: 101 mmol/L (ref 98–111)
Creatinine, Ser: 1.59 mg/dL — ABNORMAL HIGH (ref 0.61–1.24)
GFR, Estimated: 48 mL/min — ABNORMAL LOW (ref >60)
Glucose, Bld: 103 mg/dL — ABNORMAL HIGH (ref 70–99)
Potassium: 3.6 mmol/L (ref 3.5–5.1)
Sodium: 138 mmol/L (ref 135–145)
Total Bilirubin: 0.4 mg/dL (ref 0.3–1.2)
Total Protein: 7 g/dL (ref 6.5–8.1)

## 2020-10-21 LAB — LACTIC ACID, PLASMA: Lactic Acid, Venous: 2.5 mmol/L (ref 0.5–1.9)

## 2020-10-21 LAB — PROTIME-INR
INR: 1 (ref 0.8–1.2)
Prothrombin Time: 13.2 seconds (ref 11.4–15.2)

## 2020-10-21 MED ORDER — HYDROMORPHONE HCL 1 MG/ML IJ SOLN
INTRAMUSCULAR | Status: AC
  Administered 2020-10-21: 1 mg via INTRAVENOUS
  Filled 2020-10-21: qty 1

## 2020-10-21 MED ORDER — IOHEXOL 350 MG/ML SOLN
100.0000 mL | Freq: Once | INTRAVENOUS | Status: AC | PRN
  Administered 2020-10-21: 100 mL via INTRAVENOUS

## 2020-10-21 MED ORDER — PROPOFOL 10 MG/ML IV BOLUS
INTRAVENOUS | Status: AC
  Filled 2020-10-21: qty 20

## 2020-10-21 MED ORDER — KETAMINE HCL 50 MG/5ML IJ SOSY
35.0000 mg | PREFILLED_SYRINGE | Freq: Once | INTRAMUSCULAR | Status: AC
  Administered 2020-10-21: 35 mg via INTRAVENOUS

## 2020-10-21 MED ORDER — HYDROMORPHONE HCL 1 MG/ML IJ SOLN
INTRAMUSCULAR | Status: AC
  Filled 2020-10-21: qty 1

## 2020-10-21 MED ORDER — ONDANSETRON HCL 4 MG/2ML IJ SOLN
INTRAMUSCULAR | Status: AC
  Filled 2020-10-21: qty 2

## 2020-10-21 MED ORDER — HYDROMORPHONE HCL 1 MG/ML IJ SOLN
INTRAMUSCULAR | Status: AC | PRN
Start: 2020-10-21 — End: 2020-10-21
  Administered 2020-10-21: 1 mg via INTRAVENOUS

## 2020-10-21 NOTE — Progress Notes (Signed)
Orthopedic Tech Progress Note Patient Details:  Mark Vazquez December 23, 1955 [id number] Level 2 trauma not needed at this second Patient ID: Mark Vazquez, male   DOB: Jul 09, 1956, 64 y.o.   MRN: [id number]   [name] 10/21/2020, 5:08 PM

## 2020-10-21 NOTE — Progress Notes (Signed)
Orthopedic Tech Progress Note Patient Details:  Mark Vazquez 1956-01-16 [id number]  Ortho Devices Type of Ortho Device: Knee Immobilizer, Crutches Ortho Device/Splint Location: RLE Ortho Device/Splint Interventions: Application, Adjustment   Post Interventions Patient Tolerated: Well Instructions Provided: Adjustment of device, Poper ambulation with device   Mark Vazquez 10/21/2020, 8:28 PM

## 2020-10-21 NOTE — Consult Note (Addendum)
ORTHOPAEDIC CONSULTATION  REQUESTING PHYSICIAN: Blanchie Dessert, MD  Time called: 16:55  Time presented to ED: 17:15  Chief Complaint: Dislocation of the right knee  HPI: Mark Vazquez is a 64 y.o. male who complains of  Dislocation of the right knee Imaging shows dislocation of the right knee now reduced and in anatomical alignment.  Orthopedics was consulted for evaluation.   Denies history of MI, CVA, DVT, PE.  Previously ambulatory .      Past Medical History:  Diagnosis Date   Hypertension     Social History   Socioeconomic History   Marital status: Significant Other    Spouse name: Not on file   Number of children: Not on file   Years of education: Not on file   Highest education level: Not on file  Occupational History   Not on file  Tobacco Use   Smoking status: Not on file  Substance and Sexual Activity   Alcohol use: Not on file   Drug use: Not on file   Sexual activity: Not on file  Other Topics Concern   Not on file  Social History Narrative   Not on file   Social Determinants of Health   Financial Resource Strain:    Difficulty of Paying Living Expenses: Not on file  Food Insecurity:    Worried About [place] in the Last Year: Not on file   Ran Out of Food in the Last Year: Not on file  Transportation Needs:    Lack of Transportation (Medical): Not on file   Lack of Transportation (Non-Medical): Not on file  Physical Activity:    Days of Exercise per Week: Not on file   Minutes of Exercise per Session: Not on file  Stress:    Feeling of Stress : Not on file  Social Connections:    Frequency of Communication with Friends and Family: Not on file   Frequency of Social Gatherings with Friends and Family: Not on file   Attends Religious Services: Not on file   Active Member of Clubs or Organizations: Not on file   Attends Archivist Meetings: Not on file   Marital Status: Not on file   No family history on  file. Allergies  Allergen Reactions   Shellfish Allergy    Prior to Admission medications   Not on File   DG Knee Right Port  Result Date: 10/21/2020 CLINICAL DATA:  64 year old male status post reduction of right knee dislocation. EXAM: PORTABLE RIGHT KNEE - 1-2 VIEW COMPARISON:  Earlier radiograph dated 10/21/2020 FINDINGS: Interval reduction of the previously seen dislocated knee, now in anatomic alignment. There is no acute fracture or dislocation. The bones are osteopenic. No significant arthritic changes. No joint effusion. The soft tissues are unremarkable. IMPRESSION: Interval reduction of the previously seen dislocated knee, now in anatomic alignment. Electronically Signed   By: Anner Crete M.D.   On: 10/21/2020 17:36   DG Knee Right Port  Result Date: 10/21/2020 CLINICAL DATA:  Knee head against solid object EXAM: PORTABLE RIGHT KNEE - 1-2 VIEW COMPARISON:  None. FINDINGS: Frontal and lateral views were obtained. There is dislocation at the knee joint with the proximal tibia and fibula displaced anteriorly compared to the distal femur. The patella is anterior to the distal femur with the patella overlying the anterior tibia. No fracture is demonstrated. There is extensive soft tissue swelling. No erosion. IMPRESSION: Dislocation with tibia and fibula anterior to the distal femur. Patella  anterior to the distal femur with patella parallel to the anterior tibia. No fracture. Extensive soft tissue swelling. No evident arthropathic change. Electronically Signed   By: Lowella Grip III M.D.   On: 10/21/2020 17:10   DG Tibia/Fibula Right Port  Result Date: 10/21/2020 CLINICAL DATA:  Lower extremity head by solid object EXAM: PORTABLE RIGHT TIBIA AND FIBULA - 2 VIEW COMPARISON:  Right knee radiographs October 21, 2020 FINDINGS: There is dislocation of the proximal tibia and fibula anterior to the distal femur. Patella anterior to the distal femur at the level of the anterior tibial  plateau. No evident fracture. No ankle joint dislocation. No arthropathy. IMPRESSION: Dislocation in the knee region with the proximal tibia and fibula anterior to the distal femur. Patella anterior to the femur, with patella parallel to the anterior tibia. No fractures are evident. No dislocation in the ankle region. Electronically Signed   By: Lowella Grip III M.D.   On: 10/21/2020 17:11    Positive ROS: All other systems have been reviewed and were otherwise negative with the exception of those mentioned in the HPI and as above.  Objective: Labs cbc Recent Labs    10/21/20 1646 10/21/20 1647  WBC 13.0*  --   HGB 15.1 16.0  HCT 45.6 47.0  PLT 239  --     Labs inflam No results for input(s): CRP in the last 72 hours.  Invalid input(s): ESR  Labs coag Recent Labs    10/21/20 1646  INR 1.0    Recent Labs    10/21/20 1646 10/21/20 1647  NA 138 139  K 3.6 3.5  CL 101 100  CO2 27  --   GLUCOSE 103* 97  BUN 10 11  CREATININE 1.59* 1.40*  CALCIUM 9.0  --     Physical Exam: Vitals:   10/21/20 1709 10/21/20 1718  BP: 105/72   Pulse: 77   Resp: 18   Temp:    SpO2: 99% 99%   General: Alert, no acute distress.  Resting comfortably in bed Mental status: Alert and Oriented x3 Neurologic: Speech Clear and organized, no gross focal findings or movement disorder appreciated. Respiratory: No cyanosis, no use of accessory musculature Cardiovascular: No pedal edema GI: Abdomen is soft and non-tender, non-distended. Skin: Warm and dry.  No lesions in the area of chief complaint. Extremities: Warm and well perfused w/o edema. RLE now in good anatomical alignment. Psychiatric: Patient is competent for consent with normal mood and affect  MUSCULOSKELETAL:   Other extremities are atraumatic with painless ROM and NVI.  Assessment / Plan: Active Problems:   * No active hospital problems. *   Weightbearing: NWB RLE  Orthopedic device(s): Knee immobilizer  Showering:  okay to shower Pain control: per ED team Follow - up plan:  With Dr. Percell Miller Morning of December 01   Keep right knee immobilizer on until follow up except for showering. Contact information:  Edmonia Lynch MD, Roxan Hockey PA-C  Rachael Fee PA-C 10/21/2020 5:58 PM

## 2020-10-21 NOTE — ED Notes (Signed)
Pt given paper pants to wear home.

## 2020-10-21 NOTE — Progress Notes (Signed)
Pt became apneic during procedure. BMV done. Pt now has RR 26 no issues

## 2020-10-21 NOTE — ED Provider Notes (Signed)
[facility] EMERGENCY DEPARTMENT Provider Note   CSN: [id number] Arrival date & time: 10/21/20  1629     History Chief Complaint  Patient presents with  . Leg Injury    Diamond Martucci is a 64 y.o. male.  Patient is a 64 year old male with a history of hypertension and no other significant medical history presenting today after an injury to his right leg.  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  He denies any other injury.  He did not fall or injure his chest, back or abdomen.  He did not hit his head or lose consciousness.  He is complaining of 10 out of 10 pain in the right lower extremity mostly from the knee down.  He denies decreased sensation in the foot and is able to move his toes.  He reports any movement makes the pain significantly worse.  He does not take anticoagulation and denies any recent Covid-like symptoms.  The history is provided by the patient and the EMS personnel.       Past Medical History:  Diagnosis Date  . Hypertension     There are no problems to display for this patient.        No family history on file.  Social History   Tobacco Use  . Smoking status: Not on file  Substance Use Topics  . Alcohol use: Not on file  . Drug use: Not on file    Home Medications Prior to Admission medications   Not on File    Allergies    Shellfish allergy  Review of Systems   Review of Systems  All other systems reviewed and are negative.   Physical Exam Updated Vital Signs BP 105/72   Pulse 77   Temp 98.7 F (37.1 C) (Oral)   Resp 18   Ht 5\' 9"  (1.753 m)   Wt 99.8 kg   SpO2 99%   BMI 32.49 kg/m   Physical Exam Vitals and nursing note reviewed.  Constitutional:      General: He is in acute distress.     Appearance: He is well-developed. He is obese.  HENT:     Head: Normocephalic and atraumatic.     Nose: Nose normal.     Mouth/Throat:     Mouth: Mucous membranes are  moist.  Eyes:     Conjunctiva/sclera: Conjunctivae normal.     Pupils: Pupils are equal, round, and reactive to light.  Cardiovascular:     Rate and Rhythm: Normal rate and regular rhythm.     Heart sounds: No murmur heard.      Comments: Decreased pulse in the right DP and PT area but able to Doppler and capillary refill of 2 seconds. Pulmonary:     Effort: Pulmonary effort is normal. No respiratory distress.     Breath sounds: Normal breath sounds. No wheezing or rales.  Abdominal:     General: Abdomen is flat. There is no distension.     Palpations: Abdomen is soft.     Tenderness: There is no abdominal tenderness. There is no guarding or rebound.  Musculoskeletal:        General: Tenderness and deformity present.     Cervical back: Normal range of motion and neck supple.     Right hip: Normal.     Right upper leg: Tenderness present.     Right knee: Swelling, deformity and bony tenderness present. Decreased range of motion. Tenderness  present over the medial joint line and lateral joint line.     Left knee: Normal.     Right ankle: Normal.     Left ankle: Normal.     Comments: Significant deformity around the knee joint.  Pain in the distal femur, knee and proximal tib-fib area.  Patient is able to move his toes and has intact sensation  Skin:    General: Skin is warm and dry.     Capillary Refill: Capillary refill takes 2 to 3 seconds.     Findings: No erythema or rash.  Neurological:     General: No focal deficit present.     Mental Status: He is alert and oriented to person, place, and time. Mental status is at baseline.  Psychiatric:        Mood and Affect: Mood normal.        Behavior: Behavior normal.        Thought Content: Thought content normal.     ED Results / Procedures / Treatments   Labs (all labs ordered are listed, but only abnormal results are displayed) Labs Reviewed  COMPREHENSIVE METABOLIC PANEL - Abnormal; Notable for the following components:       Result Value   Glucose, Bld 103 (*)    Creatinine, Ser 1.59 (*)    GFR, Estimated 48 (*)    All other components within normal limits  CBC - Abnormal; Notable for the following components:   WBC 13.0 (*)    All other components within normal limits  LACTIC ACID, PLASMA - Abnormal; Notable for the following components:   Lactic Acid, Venous 2.5 (*)    All other components within normal limits  I-STAT CHEM 8, ED - Abnormal; Notable for the following components:   Creatinine, Ser 1.40 (*)    Calcium, Ion 1.10 (*)    All other components within normal limits  RESP PANEL BY RT-PCR (FLU A&B, COVID) ARPGX2  ETHANOL  PROTIME-INR  URINALYSIS, ROUTINE W REFLEX MICROSCOPIC  TYPE AND SCREEN  ABO/RH    EKG None  Radiology CT ANGIO LOW EXTREM LEFT W &/OR WO CONTRAST  Result Date: 10/21/2020 CLINICAL DATA:  Recent right knee dislocation EXAM: CT ANGIOGRAPHY OF THE BILATERAL LOWEREXTREMITY TECHNIQUE: Multidetector CT imaging of the bilateral lower extremitieswas performed using the standard protocol during bolus administration of intravenous contrast. Multiplanar CT image reconstructions and MIPs were obtained to evaluate the vascular anatomy. CONTRAST:  181mL OMNIPAQUE IOHEXOL 350 MG/ML SOLN COMPARISON:  Prior plain film examination from earlier in the same day. FINDINGS: Vascular: Right lower extremity: The right internal and external iliac arteries are widely patent without focal significant atherosclerotic change. The femoral bifurcation is widely patent. The superficial femoral artery is patent. The popliteal artery is patent although the timing of the contrast bolus is somewhat limits evaluation for fine intimal injury. No hematoma is identified in the popliteal fossa. No areas of active extravasation are seen. The popliteal trifurcation is patent with visualized runoff via the anterior tibial artery. The distal aspect of the posterior tibial artery is visualized although it is more proximal  aspect is incompletely evaluated. Peroneal artery is patent to the ankle. Left lower extremity: The left internal and external iliac arteries are widely patent without focal abnormality. The femoral bifurcation is patent. The superficial femoral artery and popliteal artery are widely patent. The popliteal trifurcation shows the anterior tibial and peroneal arteries to be patent to the level of the ankle. Collateral flow to the distal posterior tibial  artery is noted similar to that seen on the right. The degree of opacification on the left is slightly greater than that on the right likely related to slow flow on the right. Nonvascular: Bladder is well distended. Prostate is unremarkable. Visualized bowel is within normal limits. Bony structures show interval relocation of the right knee. No definitive fracture is noted. No significant joint effusion is seen. No other bony or soft tissue abnormality is noted. Review of the MIP images confirms the above findings. IMPRESSION: No acute bony abnormality is noted. The vascular structures of the lower extremities appear widely patent without significant caliber change to suggest intimal injury or spasm. The right popliteal and infrapopliteal vessels are less well visualized due to slower flow in the right lower extremity. Correlate with peripheral pulses. 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 Electronically Signed   By: Inez Catalina M.D.   On: 10/21/2020 20:13   CT ANGIO LOW EXTREM RIGHT W &/OR WO CONTRAST  Result Date: 10/21/2020 CLINICAL DATA:  Recent right knee dislocation EXAM: CT ANGIOGRAPHY OF THE BILATERAL LOWEREXTREMITY TECHNIQUE: Multidetector CT imaging of the bilateral lower extremitieswas performed using the standard protocol during bolus administration of intravenous contrast. Multiplanar CT image reconstructions and MIPs were obtained to evaluate the vascular anatomy.  CONTRAST:  151mL OMNIPAQUE IOHEXOL 350 MG/ML SOLN COMPARISON:  Prior plain film examination from earlier in the same day. FINDINGS: Vascular: Right lower extremity: The right internal and external iliac arteries are widely patent without focal significant atherosclerotic change. The femoral bifurcation is widely patent. The superficial femoral artery is patent. The popliteal artery is patent although the timing of the contrast bolus is somewhat limits evaluation for fine intimal injury. No hematoma is identified in the popliteal fossa. No areas of active extravasation are seen. The popliteal trifurcation is patent with visualized runoff via the anterior tibial artery. The distal aspect of the posterior tibial artery is visualized although it is more proximal aspect is incompletely evaluated. Peroneal artery is patent to the ankle. Left lower extremity: The left internal and external iliac arteries are widely patent without focal abnormality. The femoral bifurcation is patent. The superficial femoral artery and popliteal artery are widely patent. The popliteal trifurcation shows the anterior tibial and peroneal arteries to be patent to the level of the ankle. Collateral flow to the distal posterior tibial artery is noted similar to that seen on the right. The degree of opacification on the left is slightly greater than that on the right likely related to slow flow on the right. Nonvascular: Bladder is well distended. Prostate is unremarkable. Visualized bowel is within normal limits. Bony structures show interval relocation of the right knee. No definitive fracture is noted. No significant joint effusion is seen. No other bony or soft tissue abnormality is noted. Review of the MIP images confirms the above findings. IMPRESSION: No acute bony abnormality is noted. The vascular structures of the lower extremities appear widely patent without significant caliber change to suggest intimal injury or spasm. The right  popliteal and infrapopliteal vessels are less well visualized due to slower flow in the right lower extremity. Correlate with peripheral pulses. 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 Electronically Signed   By: Inez Catalina M.D.   On: 10/21/2020 20:13   DG Knee Right Port  Result Date: 10/21/2020 CLINICAL DATA:  64 year old male status post reduction of right knee dislocation. EXAM:  PORTABLE RIGHT KNEE - 1-2 VIEW COMPARISON:  Earlier radiograph dated 10/21/2020 FINDINGS: Interval reduction of the previously seen dislocated knee, now in anatomic alignment. There is no acute fracture or dislocation. The bones are osteopenic. No significant arthritic changes. No joint effusion. The soft tissues are unremarkable. IMPRESSION: Interval reduction of the previously seen dislocated knee, now in anatomic alignment. Electronically Signed   By: Anner Crete M.D.   On: 10/21/2020 17:36   DG Knee Right Port  Result Date: 10/21/2020 CLINICAL DATA:  Knee head against solid object EXAM: PORTABLE RIGHT KNEE - 1-2 VIEW COMPARISON:  None. FINDINGS: Frontal and lateral views were obtained. There is dislocation at the knee joint with the proximal tibia and fibula displaced anteriorly compared to the distal femur. The patella is anterior to the distal femur with the patella overlying the anterior tibia. No fracture is demonstrated. There is extensive soft tissue swelling. No erosion. IMPRESSION: Dislocation with tibia and fibula anterior to the distal femur. Patella anterior to the distal femur with patella parallel to the anterior tibia. No fracture. Extensive soft tissue swelling. No evident arthropathic change. Electronically Signed   By: Lowella Grip III M.D.   On: 10/21/2020 17:10   DG Tibia/Fibula Right Port  Result Date: 10/21/2020 CLINICAL DATA:  Lower extremity head by solid object EXAM: PORTABLE RIGHT TIBIA AND FIBULA - 2  VIEW COMPARISON:  Right knee radiographs October 21, 2020 FINDINGS: There is dislocation of the proximal tibia and fibula anterior to the distal femur. Patella anterior to the distal femur at the level of the anterior tibial plateau. No evident fracture. No ankle joint dislocation. No arthropathy. IMPRESSION: Dislocation in the knee region with the proximal tibia and fibula anterior to the distal femur. Patella anterior to the femur, with patella parallel to the anterior tibia. No fractures are evident. No dislocation in the ankle region. Electronically Signed   By: Lowella Grip III M.D.   On: 10/21/2020 17:11    Procedures .Sedation  Date/Time: 10/21/2020 5:24 PM Performed by: Blanchie Dessert, MD Authorized by: Blanchie Dessert, MD   Consent:    Consent obtained:  Verbal   Consent given by:  Patient   Risks discussed:  Allergic reaction, dysrhythmia, inadequate sedation, nausea, prolonged hypoxia resulting in organ damage, prolonged sedation necessitating reversal, respiratory compromise necessitating ventilatory assistance and intubation and vomiting   Alternatives discussed:  Analgesia without sedation, anxiolysis and regional anesthesia Universal protocol:    Procedure explained and questions answered to patient or proxy's satisfaction: yes     Relevant documents present and verified: yes     Test results available and properly labeled: yes     Imaging studies available: yes     Required blood products, implants, devices, and special equipment available: yes     Site/side marked: yes     Immediately prior to procedure a time out was called: yes     Patient identity confirmation method:  Verbally with patient and arm band Indications:    Procedure performed:  Dislocation reduction   Procedure necessitating sedation performed by:  Physician performing sedation Pre-sedation assessment:    Time since last food or drink:  Unknown   NPO status caution: unable to specify NPO status      ASA classification: class 2 - patient with mild systemic disease     Neck mobility: normal     Mouth opening:  3 or more finger widths   Thyromental distance:  4 finger widths   Mallampati score:  I -  soft palate, uvula, fauces, pillars visible   Pre-sedation assessments completed and reviewed: airway patency, cardiovascular function, hydration status, mental status, nausea/vomiting, pain level, respiratory function and temperature     Pre-sedation assessment completed:  10/21/2020 5:00 PM Immediate pre-procedure details:    Reassessment: Patient reassessed immediately prior to procedure     Reviewed: vital signs, relevant labs/tests and NPO status     Verified: bag valve mask available, emergency equipment available, intubation equipment available, IV patency confirmed, oxygen available and suction available   Procedure details (see MAR for exact dosages):    Preoxygenation:  Nasal cannula   Sedation:  Propofol   Intended level of sedation: deep   Analgesia:  Hydromorphone   Intra-procedure monitoring:  Blood pressure monitoring, cardiac monitor, continuous pulse oximetry, frequent LOC assessments, frequent vital sign checks and continuous capnometry   Intra-procedure events: hypoxia and respiratory depression     Intra-procedure management:  Airway repositioning and BVM ventilation   Total Provider sedation time (minutes):  10 Post-procedure details:    Post-sedation assessment completed:  10/21/2020 5:25 PM   Attendance: Constant attendance by certified staff until patient recovered     Recovery: Patient returned to pre-procedure baseline     Post-sedation assessments completed and reviewed: airway patency, cardiovascular function, hydration status, mental status, nausea/vomiting, pain level, respiratory function and temperature     Patient is stable for discharge or admission: yes     Patient tolerance:  Tolerated well, no immediate complications Reduction of  dislocation  Date/Time: 10/21/2020 5:26 PM Performed by: Blanchie Dessert, MD Authorized by: Blanchie Dessert, MD   Sedation: Patient sedated: yes Sedatives: propofol Analgesia: hydromorphone  Patient tolerance: patient tolerated the procedure well with no immediate complications Comments: Knee was flexed and loud pop and reduction back to normal anatomic position    (including critical care time)  Medications Ordered in ED Medications  ondansetron (ZOFRAN) 4 MG/2ML injection (has no administration in time range)  propofol (DIPRIVAN) 10 mg/mL bolus/IV push (has no administration in time range)  HYDROmorphone (DILAUDID) 1 MG/ML injection (has no administration in time range)  ketamine 50 mg in normal saline 5 mL (10 mg/mL) syringe (35 mg Intravenous Given 10/21/20 1642)  HYDROmorphone (DILAUDID) injection (1 mg Intravenous Given 10/21/20 1701)    ED Course  I have reviewed the triage vital signs and the nursing notes.  Pertinent labs & imaging results that were available during my care of the patient were reviewed by me and considered in my medical decision making (see chart for details).    MDM Rules/Calculators/A&P                          64 year old male presenting today after an injury at work.  Patient has obvious deformity of his right knee but at this time is neurologically intact and does have faint pulses.  Imaging shows a posterior femoral dislocation at the knee joint.  Discussed with Dr. Fredonia Highland who is on-call for orthopedics and he recommended reduction.  Given patient's severe amount of pain sedation was required and knee was reduced without complications.  When patient woke he reported the pain was much better.  Patient was placed in a knee immobilizer and will need to be touchdown weightbearing with crutches.  CTA to rule out vascular injury pending.  8:32 PM CTA shows no sign of vascular injury.  Patient's pulses are palpable.  Good capillary refill and  normal sensation with no neurologic complaints at this  time.  Patient will follow up with Dr. Percell Miller on Wednesday.  He will need an outpatient MRI.  Patient given return precautions.  Patient and his son are present and have no further questions.  MDM Number of Diagnoses or Management Options   Amount and/or Complexity of Data Reviewed Clinical lab tests: ordered and reviewed Tests in the radiology section of CPT: ordered Tests in the medicine section of CPT: reviewed and ordered Decide to obtain previous medical records or to obtain history from someone other than the patient: yes Obtain history from someone other than the patient: yes Review and summarize past medical records: yes Discuss the patient with other providers: yes Independent visualization of images, tracings, or specimens: yes  Risk of Complications, Morbidity, and/or Mortality Presenting problems: high Diagnostic procedures: moderate Management options: high  Patient Progress Patient progress: improved    Final Clinical Impression(s) / ED Diagnoses Final diagnoses:  Trauma  Right knee dislocation, initial encounter    Rx / DC Orders ED Discharge Orders    None       Blanchie Dessert, MD 10/21/20 2035

## 2020-10-21 NOTE — Progress Notes (Signed)
Chaplain responded to referral from  [facility].  Patient was resting with a family member in the room.  Nursing staff advised pt was sleeping now that he had received pain meds.  Chaplain available for follow up if needed.  [facility] Staff Chaplain

## 2020-10-21 NOTE — ED Triage Notes (Signed)
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 Pt c/o pain to right leg, EMS reports deformity to right thigh as well. GCS 15. Hx HTN, MI

## 2020-10-21 NOTE — Discharge Instructions (Addendum)
HOME CARE INSTRUCTIONS  Remove items at home which could result in a fall. This includes throw rugs or furniture in walking pathways.  ICE to the affected knee as much as tolerated. Icing helps control swelling. If the swelling is well controlled you will be more comfortable and rehab easier. Continue to use ice on the knee for pain and swelling. You may notice swelling that will progress down to the foot and ankle. This is normal after dislocation. Elevate the leg when you are not up walking on it.   Do not place pillow under knee, focus on keeping the knee straight while resting  PAIN CONTROL It is best to stay ahead of the pain by taking your pain meds tylenol every 4-6 hours. Once the pain is well controlled (you will not be pain free but the goal is having it under control) you may begin slowly weaning off the medications  DIET You may resume your previous home diet once you are discharged from the hospital.   ACTIVITY For the first 5 days the key is rest and control of pain and swelling You should rest, ice and elevate the leg for 50 minutes out of every hour. Get up and walk/stretch for 10 minutes per hour. After 5 days you can increase your activity slowly as tolerated. Maintain the knee immobilizer on at all times except when taking a shower. Only do this if you feel safe while protecting the right knee. Avoid periods of inactivity such as sitting longer than an hour when not asleep. This helps prevent blood clots.  Do not drive a car until released by your surgeon.  Do not drive while taking narcotics.   WEIGHT BEARING Do NOT bear weight on the right leg.  CONSTIPATION PROTOCOL Constipation - defined medically as fewer than three stools per week and severe constipation as less than one stool per week.  One of the most common issues patients have while taking narcotics is constipation.  Even if you have a regular bowel pattern at home, your normal regimen is likely to  be disrupted due to multiple reasons following surgery.  Combination of anesthesia, postoperative narcotics, change in appetite and fluid intake all can affect your bowels.  In order to avoid complications following surgery, here are some recommendations in order to help you during your recovery period.  Colace (docusate) - Pick up an over-the-counter form of Colace or another stool softener and take twice a day as long as you are requiring postoperative pain medications.  Take with a full glass of water daily.  If you experience loose stools or diarrhea, hold the colace until you stool forms back up.  If your symptoms do not get better within 1 week or if they get worse, check with your doctor.  MiraLax (polyethylene glycol) - Pick up over-the-counter to have on hand.  MiraLax is a solution that will increase the amount of water in your bowels to assist with bowel movements.  Take as directed and can mix with a glass of water, juice, soda, coffee, or tea.  Take if you go more than two days without a movement. Do not use MiraLax more than once per day. Call your doctor if you are still constipated or irregular after using this medication for 7 days in a row.  If you continue to have problems with postoperative constipation, please contact the office for further assistance and recommendations.  If you experience "the worst abdominal pain ever" or  develop nausea or vomiting, please contact the office immediatly for further recommendations for treatment.  ITCHING  If you experience itching with your medications, try taking only a single pain pill, or even half a pain pill at a time.  You can also use Benadryl over the counter for itching or also to help with sleep.   MEDICATIONS See your medication summary on the "After Visit Summary" that the nursing staff will review with you prior to discharge.  PRECAUTIONS If you experience chest pain or shortness of breath - call 911 immediately for transfer to the  hospital emergency department.  If you develop a fever greater that 101 F, purulent drainage from wound, increased redness or drainage from wound, foul odor from the wound/dressing, or calf pain - CONTACT YOUR SURGEON.                                                   FOLLOW-UP APPOINTMENTS You will follow up with Dr. Percell Miller on Wednesday. Please call the office to make an appointment for 8-8:30 am   MAKE SURE YOU:  Understand these instructions.  Get help right away if you are not doing well or get worse.

## 2020-10-22 ENCOUNTER — Encounter (HOSPITAL_COMMUNITY): Payer: Self-pay

## 2021-05-29 ENCOUNTER — Encounter: Payer: Self-pay | Admitting: Neurology

## 2021-05-29 ENCOUNTER — Other Ambulatory Visit: Payer: Self-pay

## 2021-05-29 DIAGNOSIS — R202 Paresthesia of skin: Secondary | ICD-10-CM

## 2021-07-08 ENCOUNTER — Ambulatory Visit (INDEPENDENT_AMBULATORY_CARE_PROVIDER_SITE_OTHER): Payer: Worker's Compensation | Admitting: Neurology

## 2021-07-08 ENCOUNTER — Other Ambulatory Visit: Payer: Self-pay

## 2021-07-08 DIAGNOSIS — G5731 Lesion of lateral popliteal nerve, right lower limb: Secondary | ICD-10-CM

## 2021-07-08 DIAGNOSIS — R202 Paresthesia of skin: Secondary | ICD-10-CM

## 2021-07-08 NOTE — Procedures (Signed)
[facility]  [place], [place]  [place],  [address] Tel: [phone] Fax:  [phone] Test Date:  07/08/2021  Patient: Mark Vazquez DOB: 1956-09-30 Physician: Narda Amber, DO  Sex: Male Height: '5\' 9"'$  Ref Phys: Edmonia Lynch, M.D.  ID#: [id number]   Technician:    Patient Complaints: This is a 65 year old man referred for evaluation of right foot drop.  NCV & EMG Findings: Extensive electrodiagnostic testing of the right lower extremity shows:  Right superficial peroneal sensory response is absent.  Right sural sensory responses within normal limits. Right peroneal motor response at the extensor digitorum brevis is absent, right peroneal motor response at the tibialis anterior shows reduced amplitude (2.1 mV) and slowed conduction velocity across the fibular head (Poplit-Fib Head, 33 m/s).  Right tibial motor responses within normal limits.   Right tibial H reflex study is within normal limits.   Active on chronic motor axonal loss changes are seen affecting the anterior tibialis and fibularis longus muscles.  Despite maximal activation, no motor unit recruitment was seen in the ankle extensor hallucis longus muscles.    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 There is no evidence of a sensorimotor polyneuropathy or lumbosacral radiculopathy affecting the right lower extremity   ___________________________ Narda Amber, DO    Nerve Conduction Studies Anti Sensory Summary Table   Stim Site NR Peak (ms) Norm Peak (ms) P-T Amp (V) Norm P-T Amp  Right Sup Peroneal Anti Sensory (Ant Lat Mall)  31C  12 cm NR  <4.6  >3  Right Sural Anti Sensory (Lat Mall)  31C  Calf    3.4 <4.6 13.7 >3   Motor Summary Table   Stim Site NR Onset (ms) Norm Onset (ms) O-P Amp (mV) Norm O-P Amp Site1 Site2 Delta-0 (ms) Dist (cm) Vel (m/s) Norm Vel (m/s)  Right Peroneal  Motor (Ext Dig Brev)  31C  Ankle NR  <6.0  >2.5 B Fib Ankle  0.0  >40  B Fib NR     Poplt B Fib  0.0  >40  Poplt NR            Right Peroneal TA Motor (Tib Ant)  31C  Fib Head    4.1 <4.5 2.1 >3 Poplit Fib Head 2.4 8.0 33 >40  Poplit    6.5  1.3         Right Tibial Motor (Abd Hall Brev)  31C  Ankle    6.0 <6.0 5.2 >4 Knee Ankle 9.3 45.0 48 >40  Knee    15.3  3.6          H Reflex Studies   NR H-Lat (ms) Lat Norm (ms) L-R H-Lat (ms)  Right Tibial (Gastroc)  31C     34.29 <35    EMG   Side Muscle Ins Act Fibs Psw Fasc Number Recrt Dur Dur. Amp Amp. Poly Poly. Comment  Right AntTibialis Nml 1+ Nml Nml [facility] Rapid All 1+ All 1+ All 1+ ATR  Right Gastroc Nml Nml Nml Nml Nml Nml Nml Nml Nml Nml Nml Nml N/A  Right Flex Dig Long Nml Nml Nml Nml Nml Nml Nml Nml Nml Nml Nml Nml N/A  Right RectFemoris Nml Nml Nml Nml Nml Nml Nml Nml Nml Nml Nml Nml N/A  Right GluteusMed Nml Nml Nml Nml Nml Nml Nml Nml Nml Nml Nml Nml N/A  Right ExtHallLong Nml Nml Nml Nml NE None - - - - - -  N/A  Right Fibularis Long Nml 1+ Nml Nml [facility] Rapid All 1+ All 1+ All 1+ N/A      Waveforms:

## 2022-07-16 DIAGNOSIS — I10 Essential (primary) hypertension: Secondary | ICD-10-CM | POA: Diagnosis not present

## 2022-07-16 DIAGNOSIS — I2510 Atherosclerotic heart disease of native coronary artery without angina pectoris: Secondary | ICD-10-CM | POA: Diagnosis not present

## 2022-07-16 DIAGNOSIS — E785 Hyperlipidemia, unspecified: Secondary | ICD-10-CM | POA: Diagnosis not present

## 2022-08-10 DIAGNOSIS — E669 Obesity, unspecified: Secondary | ICD-10-CM | POA: Diagnosis not present

## 2022-08-10 DIAGNOSIS — E785 Hyperlipidemia, unspecified: Secondary | ICD-10-CM | POA: Diagnosis not present

## 2022-08-10 DIAGNOSIS — I10 Essential (primary) hypertension: Secondary | ICD-10-CM | POA: Diagnosis not present

## 2022-08-10 DIAGNOSIS — Z87891 Personal history of nicotine dependence: Secondary | ICD-10-CM | POA: Diagnosis not present
